# Patient Record
Sex: Male | Born: 1935 | Race: White | Hispanic: No | Marital: Married | Smoking: Former smoker
Health system: Southern US, Community
[De-identification: ages and names within clinical notes are randomized; demographics above are authoritative.]

## PROBLEM LIST (undated history)

## (undated) DIAGNOSIS — E785 Hyperlipidemia, unspecified: Secondary | ICD-10-CM

## (undated) DIAGNOSIS — R911 Solitary pulmonary nodule: Secondary | ICD-10-CM

## (undated) DIAGNOSIS — N400 Enlarged prostate without lower urinary tract symptoms: Secondary | ICD-10-CM

## (undated) DIAGNOSIS — T17908A Unspecified foreign body in respiratory tract, part unspecified causing other injury, initial encounter: Secondary | ICD-10-CM

## (undated) DIAGNOSIS — J690 Pneumonitis due to inhalation of food and vomit: Secondary | ICD-10-CM

## (undated) DIAGNOSIS — J449 Chronic obstructive pulmonary disease, unspecified: Secondary | ICD-10-CM

## (undated) DIAGNOSIS — E46 Unspecified protein-calorie malnutrition: Secondary | ICD-10-CM

---

## 2020-01-03 ENCOUNTER — Encounter (HOSPITAL_COMMUNITY): Payer: Self-pay | Admitting: Internal Medicine

## 2020-01-03 ENCOUNTER — Inpatient Hospital Stay (HOSPITAL_COMMUNITY)
Admission: AD | Admit: 2020-01-03 | Discharge: 2020-01-11 | DRG: 444 | Disposition: A | Discharge status: Skilled Nursing Facility | Payer: Medicare Other | Source: Other Acute Inpatient Hospital | Attending: Family Medicine | Admitting: Family Medicine

## 2020-01-03 DIAGNOSIS — Z801 Family history of malignant neoplasm of trachea, bronchus and lung: Secondary | ICD-10-CM

## 2020-01-03 DIAGNOSIS — Z515 Encounter for palliative care: Secondary | ICD-10-CM | POA: Diagnosis not present

## 2020-01-03 DIAGNOSIS — R627 Adult failure to thrive: Secondary | ICD-10-CM | POA: Diagnosis present

## 2020-01-03 DIAGNOSIS — K8050 Calculus of bile duct without cholangitis or cholecystitis without obstruction: Secondary | ICD-10-CM

## 2020-01-03 DIAGNOSIS — E876 Hypokalemia: Secondary | ICD-10-CM | POA: Diagnosis not present

## 2020-01-03 DIAGNOSIS — F329 Major depressive disorder, single episode, unspecified: Secondary | ICD-10-CM | POA: Diagnosis present

## 2020-01-03 DIAGNOSIS — Z7951 Long term (current) use of inhaled steroids: Secondary | ICD-10-CM

## 2020-01-03 DIAGNOSIS — R1312 Dysphagia, oropharyngeal phase: Secondary | ICD-10-CM | POA: Diagnosis present

## 2020-01-03 DIAGNOSIS — E872 Acidosis: Secondary | ICD-10-CM | POA: Diagnosis not present

## 2020-01-03 DIAGNOSIS — T85528A Displacement of other gastrointestinal prosthetic devices, implants and grafts, initial encounter: Secondary | ICD-10-CM

## 2020-01-03 DIAGNOSIS — R17 Unspecified jaundice: Secondary | ICD-10-CM | POA: Diagnosis present

## 2020-01-03 DIAGNOSIS — K831 Obstruction of bile duct: Secondary | ICD-10-CM | POA: Diagnosis not present

## 2020-01-03 DIAGNOSIS — Z8249 Family history of ischemic heart disease and other diseases of the circulatory system: Secondary | ICD-10-CM

## 2020-01-03 DIAGNOSIS — E43 Unspecified severe protein-calorie malnutrition: Secondary | ICD-10-CM | POA: Diagnosis present

## 2020-01-03 DIAGNOSIS — R54 Age-related physical debility: Secondary | ICD-10-CM | POA: Diagnosis present

## 2020-01-03 DIAGNOSIS — Z20822 Contact with and (suspected) exposure to covid-19: Secondary | ICD-10-CM | POA: Diagnosis not present

## 2020-01-03 DIAGNOSIS — K851 Biliary acute pancreatitis without necrosis or infection: Secondary | ICD-10-CM | POA: Diagnosis present

## 2020-01-03 DIAGNOSIS — G3184 Mild cognitive impairment, so stated: Secondary | ICD-10-CM | POA: Diagnosis present

## 2020-01-03 DIAGNOSIS — K838 Other specified diseases of biliary tract: Secondary | ICD-10-CM | POA: Diagnosis present

## 2020-01-03 DIAGNOSIS — J439 Emphysema, unspecified: Secondary | ICD-10-CM | POA: Diagnosis present

## 2020-01-03 DIAGNOSIS — K2971 Gastritis, unspecified, with bleeding: Secondary | ICD-10-CM | POA: Diagnosis not present

## 2020-01-03 DIAGNOSIS — N400 Enlarged prostate without lower urinary tract symptoms: Secondary | ICD-10-CM | POA: Diagnosis present

## 2020-01-03 DIAGNOSIS — Z8701 Personal history of pneumonia (recurrent): Secondary | ICD-10-CM

## 2020-01-03 DIAGNOSIS — Z66 Do not resuscitate: Secondary | ICD-10-CM | POA: Diagnosis not present

## 2020-01-03 DIAGNOSIS — G9341 Metabolic encephalopathy: Secondary | ICD-10-CM | POA: Diagnosis not present

## 2020-01-03 DIAGNOSIS — J449 Chronic obstructive pulmonary disease, unspecified: Secondary | ICD-10-CM

## 2020-01-03 DIAGNOSIS — Z9981 Dependence on supplemental oxygen: Secondary | ICD-10-CM

## 2020-01-03 DIAGNOSIS — Z87891 Personal history of nicotine dependence: Secondary | ICD-10-CM

## 2020-01-03 DIAGNOSIS — N1330 Unspecified hydronephrosis: Secondary | ICD-10-CM | POA: Diagnosis not present

## 2020-01-03 DIAGNOSIS — Z7952 Long term (current) use of systemic steroids: Secondary | ICD-10-CM

## 2020-01-03 DIAGNOSIS — R64 Cachexia: Secondary | ICD-10-CM | POA: Diagnosis present

## 2020-01-03 DIAGNOSIS — Z800 Family history of malignant neoplasm of digestive organs: Secondary | ICD-10-CM

## 2020-01-03 DIAGNOSIS — Z8719 Personal history of other diseases of the digestive system: Secondary | ICD-10-CM | POA: Diagnosis not present

## 2020-01-03 DIAGNOSIS — T17908A Unspecified foreign body in respiratory tract, part unspecified causing other injury, initial encounter: Secondary | ICD-10-CM

## 2020-01-03 DIAGNOSIS — J9622 Acute and chronic respiratory failure with hypercapnia: Secondary | ICD-10-CM | POA: Diagnosis not present

## 2020-01-03 DIAGNOSIS — J9621 Acute and chronic respiratory failure with hypoxia: Secondary | ICD-10-CM | POA: Diagnosis not present

## 2020-01-03 DIAGNOSIS — E785 Hyperlipidemia, unspecified: Secondary | ICD-10-CM | POA: Diagnosis present

## 2020-01-03 DIAGNOSIS — K8071 Calculus of gallbladder and bile duct without cholecystitis with obstruction: Principal | ICD-10-CM | POA: Diagnosis present

## 2020-01-03 DIAGNOSIS — Z888 Allergy status to other drugs, medicaments and biological substances status: Secondary | ICD-10-CM

## 2020-01-03 DIAGNOSIS — R634 Abnormal weight loss: Secondary | ICD-10-CM | POA: Diagnosis not present

## 2020-01-03 DIAGNOSIS — R945 Abnormal results of liver function studies: Secondary | ICD-10-CM | POA: Diagnosis not present

## 2020-01-03 DIAGNOSIS — R109 Unspecified abdominal pain: Secondary | ICD-10-CM

## 2020-01-03 DIAGNOSIS — K859 Acute pancreatitis without necrosis or infection, unspecified: Secondary | ICD-10-CM | POA: Diagnosis not present

## 2020-01-03 DIAGNOSIS — Z7189 Other specified counseling: Secondary | ICD-10-CM | POA: Diagnosis not present

## 2020-01-03 DIAGNOSIS — R531 Weakness: Secondary | ICD-10-CM | POA: Diagnosis not present

## 2020-01-03 DIAGNOSIS — T17908S Unspecified foreign body in respiratory tract, part unspecified causing other injury, sequela: Secondary | ICD-10-CM | POA: Diagnosis not present

## 2020-01-03 DIAGNOSIS — Z79899 Other long term (current) drug therapy: Secondary | ICD-10-CM

## 2020-01-03 DIAGNOSIS — R0602 Shortness of breath: Secondary | ICD-10-CM | POA: Diagnosis not present

## 2020-01-03 DIAGNOSIS — Z9889 Other specified postprocedural states: Secondary | ICD-10-CM | POA: Diagnosis not present

## 2020-01-03 HISTORY — DX: Benign prostatic hyperplasia without lower urinary tract symptoms: N40.0

## 2020-01-03 HISTORY — DX: Unspecified foreign body in respiratory tract, part unspecified causing other injury, initial encounter: T17.908A

## 2020-01-03 HISTORY — DX: Hyperlipidemia, unspecified: E78.5

## 2020-01-03 HISTORY — DX: Pneumonitis due to inhalation of food and vomit: J69.0

## 2020-01-03 HISTORY — DX: Solitary pulmonary nodule: R91.1

## 2020-01-03 HISTORY — DX: Unspecified protein-calorie malnutrition: E46

## 2020-01-03 HISTORY — DX: Chronic obstructive pulmonary disease, unspecified: J44.9

## 2020-01-03 LAB — COMPREHENSIVE METABOLIC PANEL
ALT: 311 U/L — ABNORMAL HIGH (ref 0–44)
AST: 143 U/L — ABNORMAL HIGH (ref 15–41)
Albumin: 3.7 g/dL (ref 3.5–5.0)
Alkaline Phosphatase: 307 U/L — ABNORMAL HIGH (ref 38–126)
Anion gap: 13 (ref 5–15)
BUN: 28 mg/dL — ABNORMAL HIGH (ref 8–23)
CO2: 25 mmol/L (ref 22–32)
Calcium: 9.4 mg/dL (ref 8.9–10.3)
Chloride: 99 mmol/L (ref 98–111)
Creatinine, Ser: 0.83 mg/dL (ref 0.61–1.24)
GFR calc Af Amer: >60 mL/min (ref >60)
GFR calc non Af Amer: >60 mL/min (ref >60)
Glucose, Bld: 94 mg/dL (ref 70–99)
Potassium: 5 mmol/L (ref 3.5–5.1)
Sodium: 137 mmol/L (ref 135–145)
Total Bilirubin: 3.4 mg/dL — ABNORMAL HIGH (ref 0.3–1.2)
Total Protein: 6.4 g/dL — ABNORMAL LOW (ref 6.5–8.1)

## 2020-01-03 LAB — CBC WITH DIFFERENTIAL/PLATELET
Abs Immature Granulocytes: 0.05 10*3/uL (ref 0.00–0.07)
Basophils Absolute: 0 10*3/uL (ref 0.0–0.1)
Basophils Relative: 0 %
Eosinophils Absolute: 0 10*3/uL (ref 0.0–0.5)
Eosinophils Relative: 0 %
HCT: 44.7 % (ref 39.0–52.0)
Hemoglobin: 14 g/dL (ref 13.0–17.0)
Immature Granulocytes: 1 %
Lymphocytes Relative: 2 %
Lymphs Abs: 0.2 10*3/uL — ABNORMAL LOW (ref 0.7–4.0)
MCH: 31.5 pg (ref 26.0–34.0)
MCHC: 31.3 g/dL (ref 30.0–36.0)
MCV: 100.7 fL — ABNORMAL HIGH (ref 80.0–100.0)
Monocytes Absolute: 0.5 10*3/uL (ref 0.1–1.0)
Monocytes Relative: 5 %
Neutro Abs: 8.8 10*3/uL — ABNORMAL HIGH (ref 1.7–7.7)
Neutrophils Relative %: 92 %
Platelets: 223 10*3/uL (ref 150–400)
RBC: 4.44 MIL/uL (ref 4.22–5.81)
RDW: 14.5 % (ref 11.5–15.5)
WBC: 9.6 10*3/uL (ref 4.0–10.5)
nRBC: 0 % (ref 0.0–0.2)

## 2020-01-03 LAB — AMYLASE: Amylase: 48 U/L (ref 28–100)

## 2020-01-03 LAB — LIPASE, BLOOD: Lipase: 18 U/L (ref 11–51)

## 2020-01-03 MED ORDER — FINASTERIDE 5 MG PO TABS
5.0000 mg | ORAL_TABLET | Freq: Every day | ORAL | Status: DC
  Administered 2020-01-04 – 2020-01-11 (×8): 5 mg via ORAL
  Filled 2020-01-03 (×8): qty 1

## 2020-01-03 MED ORDER — ARFORMOTEROL TARTRATE 15 MCG/2ML IN NEBU
15.0000 ug | INHALATION_SOLUTION | Freq: Two times a day (BID) | RESPIRATORY_TRACT | Status: DC
  Administered 2020-01-04 – 2020-01-11 (×14): 15 ug via RESPIRATORY_TRACT
  Filled 2020-01-03 (×17): qty 2

## 2020-01-03 MED ORDER — SENNA 8.6 MG PO TABS
1.0000 | ORAL_TABLET | Freq: Two times a day (BID) | ORAL | Status: DC
  Administered 2020-01-03 – 2020-01-11 (×16): 8.6 mg via ORAL
  Filled 2020-01-03 (×16): qty 1

## 2020-01-03 MED ORDER — ALBUTEROL SULFATE HFA 108 (90 BASE) MCG/ACT IN AERS: 2.0000 | INHALATION_SPRAY | Freq: Four times a day (QID) | RESPIRATORY_TRACT | Status: DC | PRN

## 2020-01-03 MED ORDER — PIPERACILLIN-TAZOBACTAM 3.375 G IVPB 30 MIN: 3.3750 g | Freq: Four times a day (QID) | INTRAVENOUS | Status: DC

## 2020-01-03 MED ORDER — ACETAMINOPHEN 325 MG PO TABS
650.0000 mg | ORAL_TABLET | Freq: Four times a day (QID) | ORAL | Status: DC | PRN
  Administered 2020-01-07 – 2020-01-08 (×2): 650 mg via ORAL
  Filled 2020-01-03 (×2): qty 2

## 2020-01-03 MED ORDER — DEXTROSE-NACL 5-0.45 % IV SOLN: INTRAVENOUS | Status: AC

## 2020-01-03 MED ORDER — OXYCODONE HCL 5 MG PO TABS: 5.0000 mg | ORAL_TABLET | ORAL | Status: DC | PRN

## 2020-01-03 MED ORDER — PIPERACILLIN-TAZOBACTAM 3.375 G IVPB
3.3750 g | Freq: Three times a day (TID) | INTRAVENOUS | Status: AC
  Administered 2020-01-03 – 2020-01-05 (×6): 3.375 g via INTRAVENOUS
  Administered 2020-01-05: 3.375 mg via INTRAVENOUS
  Administered 2020-01-06 (×3): 3.375 g via INTRAVENOUS
  Filled 2020-01-03 (×10): qty 50

## 2020-01-03 MED ORDER — ALBUTEROL SULFATE (2.5 MG/3ML) 0.083% IN NEBU
2.5000 mg | INHALATION_SOLUTION | Freq: Four times a day (QID) | RESPIRATORY_TRACT | Status: DC | PRN
  Administered 2020-01-03: 2.5 mg via RESPIRATORY_TRACT
  Filled 2020-01-03: qty 3

## 2020-01-03 MED ORDER — ACETAMINOPHEN 650 MG RE SUPP: 650.0000 mg | Freq: Four times a day (QID) | RECTAL | Status: DC | PRN

## 2020-01-03 MED ORDER — FLUOXETINE HCL 20 MG PO CAPS
40.0000 mg | ORAL_CAPSULE | Freq: Every day | ORAL | Status: DC
  Administered 2020-01-04 – 2020-01-11 (×8): 40 mg via ORAL
  Filled 2020-01-03 (×8): qty 2

## 2020-01-03 MED ORDER — HEPARIN SODIUM (PORCINE) 5000 UNIT/ML IJ SOLN
5000.0000 [IU] | Freq: Three times a day (TID) | INTRAMUSCULAR | Status: DC
  Administered 2020-01-03 – 2020-01-04 (×2): 5000 [IU] via SUBCUTANEOUS
  Filled 2020-01-03 (×2): qty 1

## 2020-01-03 MED ORDER — ENOXAPARIN SODIUM 40 MG/0.4ML ~~LOC~~ SOLN
40.0000 mg | Freq: Every day | SUBCUTANEOUS | Status: DC
  Filled 2020-01-03: qty 0.4

## 2020-01-03 MED ORDER — TAMSULOSIN HCL 0.4 MG PO CAPS
0.4000 mg | ORAL_CAPSULE | Freq: Every day | ORAL | Status: DC
  Administered 2020-01-04 – 2020-01-11 (×8): 0.4 mg via ORAL
  Filled 2020-01-03 (×8): qty 1

## 2020-01-03 NOTE — H&P (Signed)
History and Physical    Shawn Davis MLY:[id number] DOB: 04/20/1936 DOA: 01/03/2020  PCP: Marlise Eves, MD (Confirm with patient/family/NH records and if not entered, this has to be entered at [facility] point of entry) Patient coming from: transfer from [facility]  I have personally briefly reviewed patient's old medical records in [facility]  Chief Complaint: weight loss, pancreatitis  HPI: Shawn Davis is a 84 y.o. male with medical history significant of steroid dependent COPD, h/o aspiration and aspiration pneumonia, BPH, deprression. He was receiving home health care and care givers recommended family take him to ED due to rapid weight loss and FTT. He had not been c/o pain or discomfort. His COPD was at baseline. No report of fever, N/V. He is not a good historian   ED Course: Patient was held in ED at [facility] for several days. His workup included CT ab/Pel revealing intra/extrahepatic ductal dilitation. He was diagnosed with acute pancreatitis but ERCP was not available there or MRCP. No beds were available at surrounding hospitals. He was accepted as a transfer 01/01/20 and arrived [facility] today. History is primarily from his daughter by telephone.  Review of Systems: As per HPI otherwise 10 point review of systems negative.    Past Medical History:  Diagnosis Date   Aspiration pneumonia (HCC)    Aspiration, chronic pulmonary    BPH (benign prostatic hyperplasia)    COPD (chronic obstructive pulmonary disease) (HCC)    HLD (hyperlipidemia)    Protein calorie malnutrition (HCC)    Pulmonary nodule, right     History reviewed. No pertinent surgical history.   Soc Hx - married 60 years. Two daughters. Lives with wife. Worked as Printmaker.    reports that he has quit smoking. He does not have any smokeless tobacco history on file. He reports previous alcohol use. He reports that he does not use drugs.  Allergies    Allergen Reactions   Budesonide-Formoterol Fumarate Shortness Of Breath and Other (See Comments)    Chest pain, cough  From external records; As of 01/03/20, pt states no allergies       Family History  Problem Relation Age of Onset   Pancreatic cancer Mother    CAD Mother    Lung cancer Father    CAD Brother      Prior to Admission medications   Medication Sig Start Date End Date Taking? Authorizing Provider  albuterol (VENTOLIN HFA) 108 (90 Base) MCG/ACT inhaler Inhale 2 puffs into the lungs every 6 (six) hours as needed for shortness of breath or wheezing. 12/08/19  Yes [provider]  finasteride (PROSCAR) 5 MG tablet Take 5 mg by mouth daily. 12/08/19  Yes [provider]  FLUoxetine (PROZAC) 40 MG capsule Take 40 mg by mouth daily. 12/08/19  Yes [provider]  STRIVERDI RESPIMAT 2.5 MCG/ACT AERS Inhale 2 puffs into the lungs daily. 12/08/19  Yes [provider]  tamsulosin (FLOMAX) 0.4 MG CAPS capsule Take 0.4 mg by mouth daily. 12/08/19  Yes [provider]    Physical Exam: Vitals:   01/03/20 1945  BP: 124/70  Pulse: 90  Resp: 14  Temp: 97.6 F (36.4 C)  TempSrc: Axillary  SpO2: 99%     Vitals:   01/03/20 1945  BP: 124/70  Pulse: 90  Resp: 14  Temp: 97.6 F (36.4 C)  TempSrc: Axillary  SpO2: 99%   General:- emaciated man in no acute  distress Eyes: PERRL, lids and conjunctivae normal ENMT: Mucous membranes are dry. Posterior pharynx clear of any exudate or lesions. Edentulous with upper denture in place  Neck: emaciated, supple, no masses, no thyromegaly Respiratory: Increased WOB with neck retractions and abdominal musculature use, no wheezing, no rales.  Cardiovascular: Regular rate and rhythm, no murmurs / rubs / gallops. No extremity edema. 1+ pedal pulses. No carotid bruits.  Abdomen: Scaphoid. Positive guarding vs respiratory use, tender to percussion in the RUQ and epigastrum, tendern to firm  palpation in the epigastrum and RUQ. No masses. Musculoskeletal: no clubbing / cyanosis. No joint deformity upper and lower extremities. Good ROM, no contractures. Muscle wasting. Emaciated throughout..  Skin: multiple bruises both arms, Skin abrasion with duoderm dressing in place left posterior chest. Neurologic: CN 2-12 grossly intact.Strength 3/5 in all 4.  Psychiatric: Normal judgment and insight. Alert and oriented x 3. Normal mood.     Labs on Admission: I have personally reviewed following labs and imaging studies  CBC: No results for input(s): WBC, NEUTROABS, HGB, HCT, MCV, PLT in the last 168 hours. Basic Metabolic Panel: No results for input(s): NA, K, CL, CO2, GLUCOSE, BUN, CREATININE, CALCIUM, MG, PHOS in the last 168 hours. GFR: CrCl cannot be calculated (No successful lab value found.). Liver Function Tests: No results for input(s): AST, ALT, ALKPHOS, BILITOT, PROT, ALBUMIN in the last 168 hours. No results for input(s): LIPASE, AMYLASE in the last 168 hours. No results for input(s): AMMONIA in the last 168 hours. Coagulation Profile: No results for input(s): INR, PROTIME in the last 168 hours. Cardiac Enzymes: No results for input(s): CKTOTAL, CKMB, CKMBINDEX, TROPONINI in the last 168 hours. BNP (last 3 results) No results for input(s): PROBNP in the last 8760 hours. HbA1C: No results for input(s): HGBA1C in the last 72 hours. CBG: No results for input(s): GLUCAP in the last 168 hours. Lipid Profile: No results for input(s): CHOL, HDL, LDLCALC, TRIG, CHOLHDL, LDLDIRECT in the last 72 hours. Thyroid Function Tests: No results for input(s): TSH, T4TOTAL, FREET4, T3FREE, THYROIDAB in the last 72 hours. Anemia Panel: No results for input(s): VITAMINB12, FOLATE, FERRITIN, TIBC, IRON, RETICCTPCT in the last 72 hours. Urine analysis: No results found for: COLORURINE, APPEARANCEUR, LABSPEC, PHURINE, GLUCOSEU, HGBUR, BILIRUBINUR, KETONESUR, PROTEINUR, UROBILINOGEN,  NITRITE, LEUKOCYTESUR  Radiological Exams on Admission: No results found.  EKG: Independently reviewed. Outside EGK NSR w/o acute changes  Assessment/Plan Active Problems:   Pancreatitis, acute   Steroid-dependent COPD (HCC)   BPH (benign prostatic hyperplasia)   Chronic pulmonary aspiration  (please populate well all problems here in Problem List. (For example, if patient is on BP meds at home and you resume or decide to hold them, it is a problem that needs to be her. Same for CAD, COPD, HLD and so on)   1. Pancreatitis - reason for transfer was for specialty evaluation and procedure, e.g MRCP and possible ERCP. He was started on Zosyn at outside hospital. He had CT revealing ductal dilitation and GB sludge with multiple small stones.  He denies pain but is tender on exam. Plan Med-surg admit  Labs - CMEt, lipase, amylase, CBCD  Continue Zosyn to cover for intra-abdominal infection  Pain mgt.   MRCP ordered  Will need GI consult 8/15-called answering service  2. Steroid dependent COPD Plan Continue home meds  Add Albuterol neb q2 prn wheezing  Continue oxygen at 2 L   3. BPH - continue home meds  4. Code status - spoke at length  with daughter Shawn Davis and patient - does not wish life-support or heroic measures.  DVT prophylaxis: heparin  Code Status: DNR  Family Communication: spoke at length with Shawn Davis, daughter  Disposition Plan: TBD - TOC consult placed  Consults called: GI - notified [facility] answering service for consult in AM (with names) Admission status: inpatient    Illene Regulus MD [facility] Pager [phone]  If 7PM-7AM, please contact night-coverage [email or web address] Password [facility]  01/03/2020, 10:38 PM   \

## 2020-01-04 ENCOUNTER — Other Ambulatory Visit: Payer: Self-pay

## 2020-01-04 ENCOUNTER — Inpatient Hospital Stay: Payer: Self-pay

## 2020-01-04 ENCOUNTER — Encounter (HOSPITAL_COMMUNITY): Payer: Self-pay | Admitting: Internal Medicine

## 2020-01-04 DIAGNOSIS — K838 Other specified diseases of biliary tract: Secondary | ICD-10-CM

## 2020-01-04 DIAGNOSIS — R634 Abnormal weight loss: Secondary | ICD-10-CM

## 2020-01-04 DIAGNOSIS — R945 Abnormal results of liver function studies: Secondary | ICD-10-CM | POA: Diagnosis not present

## 2020-01-04 DIAGNOSIS — K859 Acute pancreatitis without necrosis or infection, unspecified: Secondary | ICD-10-CM

## 2020-01-04 LAB — HEPATITIS PANEL, ACUTE
HCV Ab: NONREACTIVE
Hep A IgM: NONREACTIVE
Hep B C IgM: NONREACTIVE
Hepatitis B Surface Ag: NONREACTIVE

## 2020-01-04 LAB — COMPREHENSIVE METABOLIC PANEL
ALT: 293 U/L — ABNORMAL HIGH (ref 0–44)
AST: 132 U/L — ABNORMAL HIGH (ref 15–41)
Albumin: 3.5 g/dL (ref 3.5–5.0)
Alkaline Phosphatase: 273 U/L — ABNORMAL HIGH (ref 38–126)
Anion gap: 12 (ref 5–15)
BUN: 29 mg/dL — ABNORMAL HIGH (ref 8–23)
CO2: 28 mmol/L (ref 22–32)
Calcium: 9 mg/dL (ref 8.9–10.3)
Chloride: 97 mmol/L — ABNORMAL LOW (ref 98–111)
Creatinine, Ser: 0.83 mg/dL (ref 0.61–1.24)
GFR calc Af Amer: >60 mL/min (ref >60)
GFR calc non Af Amer: >60 mL/min (ref >60)
Glucose, Bld: 131 mg/dL — ABNORMAL HIGH (ref 70–99)
Potassium: 4.6 mmol/L (ref 3.5–5.1)
Sodium: 137 mmol/L (ref 135–145)
Total Bilirubin: 2.4 mg/dL — ABNORMAL HIGH (ref 0.3–1.2)
Total Protein: 5.9 g/dL — ABNORMAL LOW (ref 6.5–8.1)

## 2020-01-04 LAB — CBC
HCT: 41.5 % (ref 39.0–52.0)
Hemoglobin: 13.2 g/dL (ref 13.0–17.0)
MCH: 32.2 pg (ref 26.0–34.0)
MCHC: 31.8 g/dL (ref 30.0–36.0)
MCV: 101.2 fL — ABNORMAL HIGH (ref 80.0–100.0)
Platelets: 222 10*3/uL (ref 150–400)
RBC: 4.1 MIL/uL — ABNORMAL LOW (ref 4.22–5.81)
RDW: 14.6 % (ref 11.5–15.5)
WBC: 8.8 10*3/uL (ref 4.0–10.5)
nRBC: 0 % (ref 0.0–0.2)

## 2020-01-04 LAB — SARS CORONAVIRUS 2 BY RT PCR (HOSPITAL ORDER, PERFORMED IN ~~LOC~~ HOSPITAL LAB): SARS Coronavirus 2: NEGATIVE

## 2020-01-04 LAB — MAGNESIUM: Magnesium: 2 mg/dL (ref 1.7–2.4)

## 2020-01-04 LAB — LIPASE, BLOOD: Lipase: 22 U/L (ref 11–51)

## 2020-01-04 NOTE — Evaluation (Signed)
Physical Therapy Evaluation Patient Details Name: Shawn Davis MRN: [id number] DOB: [birth year] Today's Date: 01/04/2020   History of Present Illness  84 y.o. male with medical history significant of steroid dependent COPD, h/o aspiration and aspiration pneumonia, BPH, protein calorie malnutrition depression admitted to [facility] 8/14 for acute pancreatitis. Recent hx of failure to thrive and weight loss.  Clinical Impression  Mr. Chanz Davis is an 84 year old man admitted to [facility] for acute pancreatitis with family reporting recent failure to thrive and weight loss  Pt presents with functional mobility limitations 2* generalized weakness, ambulatory balance deficits, and ltd endurance.  Patient mildly dyspneic during activity on 2L [place] and patient placed [place] in mouth to assist with recovery. O2 sats WNL beween 98-100%. Patient required min assist for mobility and max assist for lower body bathing and toileting after incontinent episode. Patient pleasant and mostly oriented with deficits of memory. Patient will benefit from skilled PT services to maximize IND and functional abilities for return home at discharge. Patient receiving HH services prior to admission and therapist recommends resuming therapy at discharge    Follow Up Recommendations Home health PT    Equipment Recommendations  None recommended by PT    Recommendations for Other Services       Precautions / Restrictions Precautions Precautions: Fall Restrictions Weight Bearing Restrictions: No      Mobility  Bed Mobility Overal bed mobility: Needs Assistance Bed Mobility: Supine to Sit     Supine to sit: Min assist     General bed mobility comments: min assist to bring trunk to upright  Transfers Overall transfer level: Needs assistance Equipment used: Rolling walker (2 wheeled) Transfers: Sit to/from [employer] Sit to Stand: Min assist Stand pivot transfers: Min assist       General transfer  comment: cues for use of UEs to self assist;  Physical assist to bring wt up and fwd and to balance in initial standing.   Pt stood from bedside x 3 and stood with RW and min assist for balance to be assisted with hygiene 2* bowel incontinence  Ambulation/Gait Ambulation/Gait assistance: Min assist;+2 safety/equipment Gait Distance (Feet): 5 Feet Assistive device: Rolling walker (2 wheeled) Gait Pattern/deviations: Step-to pattern;Decreased step length - right;Decreased step length - left;Shuffle;Trunk flexed     General Gait Details: cues for posture and position from RW; distance ltd by fatigue/SOB with exertion  Stairs            Wheelchair Mobility    Modified Rankin (Stroke Patients Only)       Balance Overall balance assessment: Needs assistance Sitting-balance support: No upper extremity supported;Feet supported Sitting balance-Leahy Scale: Good     Standing balance support: Bilateral upper extremity supported;During functional activity Standing balance-Leahy Scale: Poor Standing balance comment: reliance on upper extremities                             Pertinent Vitals/Pain Pain Assessment: No/denies pain    Home Living Family/patient expects to be discharged to:: Private residence Living Arrangements: Spouse/significant other Available Help at Discharge: Family;Available 24 hours/day Type of Home: House Home Access: Stairs to enter     Home Layout: One level Home Equipment: Environmental consultant - 2 wheels Additional Comments: Patient lives at home with his wife. Has been receiving HH OT/PT. ON 2-3L oxygen at home.    Prior Function Level of Independence: Independent with assistive device(s)  Hand Dominance   Dominant Hand: Right    Extremity/Trunk Assessment   Upper Extremity Assessment Upper Extremity Assessment: Overall WFL for tasks assessed RUE Deficits / Details: 5/5 strength throughout, some loss of shoulder ROM LUE  Deficits / Details: 5/5 strength throughout, some loss of shoulder ROM    Lower Extremity Assessment Lower Extremity Assessment: Overall WFL for tasks assessed    Cervical / Trunk Assessment Cervical / Trunk Assessment: Kyphotic  Communication   Communication: No difficulties  Cognition Arousal/Alertness: Awake/alert Behavior During Therapy: WFL for tasks assessed/performed;Impulsive Overall Cognitive Status: History of cognitive impairments - at baseline                                 General Comments: Daughter reports impaired short term memory recently.      General Comments      Exercises     Assessment/Plan    PT Assessment Patient needs continued PT services  PT Problem List Decreased activity tolerance;Decreased balance;Decreased mobility;Decreased cognition;Decreased knowledge of use of DME       PT Treatment Interventions DME instruction;Gait training;Stair training;Functional mobility training;Therapeutic activities;Therapeutic exercise;Patient/family education    PT Goals (Current goals can be found in the Care Plan section)  Acute Rehab PT Goals Patient Stated Goal: Did not state PT Goal Formulation: With patient Time For Goal Achievement: 01/18/20 Potential to Achieve Goals: Fair    Frequency Min 3X/week   Barriers to discharge        Co-evaluation PT/OT/SLP Co-Evaluation/Treatment: Yes   PT goals addressed during session: Mobility/safety with mobility OT goals addressed during session: ADL's and self-care       AM-PAC PT "6 Clicks" Mobility  Outcome Measure Help needed turning from your back to your side while in a flat bed without using bedrails?: None Help needed moving from lying on your back to sitting on the side of a flat bed without using bedrails?: A Little Help needed moving to and from a bed to a chair (including a wheelchair)?: A Little Help needed standing up from a chair using your arms (e.g., wheelchair or bedside  chair)?: A Little Help needed to walk in hospital room?: A Little Help needed climbing 3-5 steps with a railing? : A Lot 6 Click Score: 18    End of Session Equipment Utilized During Treatment: Gait belt;Oxygen Activity Tolerance: Patient limited by fatigue Patient left: with call bell/phone within reach;Other (comment) (BSC) Nurse Communication: Mobility status PT Visit Diagnosis: Unsteadiness on feet (R26.81);Muscle weakness (generalized) (M62.81);Difficulty in walking, not elsewhere classified (R26.2)    Time: 8921-1941 PT Time Calculation (min) (ACUTE ONLY): 25 min   Charges:   PT Evaluation $PT Eval Low Complexity: 1 Low          Mauro Kaufmann PT Acute Rehabilitation Services Pager [phone] Office [phone]   Cleo Santucci 01/04/2020, 2:18 PM

## 2020-01-04 NOTE — Progress Notes (Signed)
PROGRESS NOTE    Shawn Davis  TOI:[id number] DOB: [birth year] DOA: 01/03/2020 PCP: Darius Bump, MD   Chief complaint: FTT  Brief Narrative: 84 y.o. male with medical history significant of steroid dependent COPD, h/o aspiration and aspiration pneumonia, BPH, protein calorie malnutrition depression who has had rapid weight loss and failure to thrive and sent to the ED at [facility] 01/01/20 where work-up showed CT scan showing intra-/extrahepatic duct dilatation diagnosing acute pancreatitis and transferred to [facility] 01/03/20.   Subjective:  Overnight no fever blood pressure is stable, saturating well on room air. Labs AST 143 ALT 311, alk phos 307, lipase 18 lft slightly better this am.  Assessment & Plan:  Pancreatitis, acute, diagnosed at outside ER.Patient is n.p.o., on D5 half-normal saline, empiric Zosyn and and conservative treatment.MRCP pending.  GI has been consulted overnight on admission.  Noted transaminitis with normal lipase.Repeat CMP lipase this morning and viral hepatitis panel. He denies any abdominal pain nausea or vomiting.  Failure to thrive/rapid weight loss:poor oral intake for few months, with chronic medical condition. Follow-up MRI.Dietitian consulted.  COPD/Chronic hypoxic respiratory failure on 2-3 l [place].per daughter "He has trouble breathing all the time": On room air.  To new inhaler.  BPH continue Flomax/finasteride.  Chronic pulmonary aspiration history followed by speech and nutrition at home.   DVT prophylaxis: SCD and consider prophylaxis if no procedure Code Status:   Code Status: DNR  Family Communication: plan of care discussed with patient and daughter at bedside.  Status is: Inpatient Remains inpatient appropriate because:Ongoing diagnostic testing needed not appropriate for outpatient work up and IV treatments appropriate due to intensity of illness or inability to take PO Dispo:The patient is from:  Home            Anticipated d/c is to: Home            Anticipated d/c date is: 2 days            Patient currently is not medically stable to d/c.   Nutrition: Diet Order            Diet NPO time specified Except for: Sips with Meds  Diet effective now                       There is no height or weight on file to calculate BMI.  Consultants:see note  Procedures:see note Microbiology:see note Blood Culture No results found for: SDES, SPECREQUEST, CULT, REPTSTATUS  Other culture-see note  Medications: Scheduled Meds: . arformoterol  15 mcg Nebulization BID  . finasteride  5 mg Oral Daily  . FLUoxetine  40 mg Oral Daily  . senna  1 tablet Oral BID  . tamsulosin  0.4 mg Oral Daily   Continuous Infusions: . dextrose 5 % and 0.45% NaCl 75 mL/hr at 01/03/20 2259  . piperacillin-tazobactam (ZOSYN)  IV 3.375 g (01/04/20 0525)    Antimicrobials: Anti-infectives (From admission, onward)   Start     Dose/Rate Route Frequency Ordered Stop   01/04/20 0000  piperacillin-tazobactam (ZOSYN) IVPB 3.375 g  Status:  Discontinued        3.375 g 100 mL/hr over 30 Minutes Intravenous Every 6 hours 01/03/20 2230 01/03/20 2235   01/03/20 2245  piperacillin-tazobactam (ZOSYN) IVPB 3.375 g     Discontinue     3.375 g 12.5 mL/hr over 240 Minutes Intravenous Every 8 hours 01/03/20 2235  Objective: Vitals: Today's Vitals   01/03/20 1945 01/03/20 2340 01/04/20 0342 01/04/20 0901  BP: 124/70 (!) 154/82 (!) 162/79   Pulse: 90 95 82   Resp: _0 Temp: 97.6 F (36.4 C) 98.2 F (36.8 C) 97.7 F (36.5 C)   TempSrc: Axillary Oral Oral   SpO2: 99% 98% 98% 98%  PainSc: 0-No pain       Intake/Output Summary (Last 24 hours) at 01/04/2020 0906 Last data filed at 01/04/2020 0600 Gross per 24 hour  Intake 582.16 ml  Output 75 ml  Net 507.16 ml   There were no vitals filed for this visit. Weight change:    Intake/Output from previous day: 08/14 0701 - 08/15 0700 In:  582.2 [I.V.:525; IV Piggyback:57.2] Out: 75 [Urine:75] Intake/Output this shift: No intake/output data recorded.  Examination:  General exam: AAOx3, frail, on [place] 02,  HEENT:Oral mucosa moist, Ear/Nose WNL grossly,dentition normal. Respiratory system: bilaterally diminished,no wheezing or crackles,no use of accessory muscle, non tender. Cardiovascular system: S1 & S2 +, regular, No JVD. Gastrointestinal system: Abdomen soft, scaphoid, NT,ND, BS+. Nervous System:Alert, awake, moving extremities and grossly nonfocal Extremities: No edema, distal peripheral pulses palpable.  Skin: No rashes,no icterus. MSK: Normal muscle bulk,tone, power  Data Reviewed: I have personally reviewed following labs and imaging studies CBC: Recent Labs  Lab 01/03/20 2256  WBC 9.6  NEUTROABS 8.8*  HGB 14.0  HCT 44.7  MCV 100.7*  PLT 160   Basic Metabolic Panel: Recent Labs  Lab 01/03/20 2256 01/04/20 0815  NA 137 137  K 5.0 4.6  CL 99 97*  CO2 25 28  GLUCOSE 94 131*  BUN 28* 29*  CREATININE 0.83 0.83  CALCIUM 9.4 9.0  MG  --  2.0   GFR: CrCl cannot be calculated (Unknown ideal weight.). Liver Function Tests: Recent Labs  Lab 01/03/20 2256 01/04/20 0815  AST 143* 132*  ALT 311* 293*  ALKPHOS 307* 273*  BILITOT 3.4* 2.4*  PROT 6.4* 5.9*  ALBUMIN 3.7 3.5   Recent Labs  Lab 01/03/20 2256 01/04/20 0815  LIPASE 18 22  AMYLASE 48  --    No results for input(s): AMMONIA in the last 168 hours. Coagulation Profile: No results for input(s): INR, PROTIME in the last 168 hours. Cardiac Enzymes: No results for input(s): CKTOTAL, CKMB, CKMBINDEX, TROPONINI in the last 168 hours. BNP (last 3 results) No results for input(s): PROBNP in the last 8760 hours. HbA1C: No results for input(s): HGBA1C in the last 72 hours. CBG: No results for input(s): GLUCAP in the last 168 hours. Lipid Profile: No results for input(s): CHOL, HDL, LDLCALC, TRIG, CHOLHDL, LDLDIRECT in the last 72  hours. Thyroid Function Tests: No results for input(s): TSH, T4TOTAL, FREET4, T3FREE, THYROIDAB in the last 72 hours. Anemia Panel: No results for input(s): VITAMINB12, FOLATE, FERRITIN, TIBC, IRON, RETICCTPCT in the last 72 hours. Sepsis Labs: No results for input(s): PROCALCITON, LATICACIDVEN in the last 168 hours.  No results found for this or any previous visit (from the past 240 hour(s)).    Radiology Studies: No results found.   LOS: 1 day   Antonieta Pert, MD [facility]  01/04/2020, 9:06 AM

## 2020-01-04 NOTE — Consult Note (Signed)
Referring Provider:  [facility]         Primary Care Physician:  Marlise EvesButler, Amy E, MD Primary Gastroenterologist: Gentry FitzUnassigned.           We were asked to see this patient for:   Bile duct stones               ASSESSMENT /  PLAN    CC: Bile duct stones  Shawn Davis is a 84 y.o. male PMH significant for, but not necessarily limited to, severe COPD, aspiration pneumonia, BPH                                                                                                                                    #Abnormal LFTs/biliary duct dilation on CT scan done at [facility] --Patient has his gallbladder intact with calculi present --Patient is at high risk for endoscopic procedure given severe COPD.  Plan is for MRCP today and depending on results he will likely need ERCP with stone extraction tomorrow.  The benefits and risk of ERCP including perforation, infection, pancreatitis, and complications of sedation were discussed with the patient and his daughter.  Patient agrees to proceed with ERCP should if be necessary.  --If turns out to have choledocholithiasis then will need surgical consult for consideration of cholecystectomy  #  Malnourishment / recent weight loss.    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    HPI:    Chief Complaint: None.   Shawn Davis is a 84 y.o. male with severe COPD who was transferred here from a hospital in [place].  Patient's daughter provides most of the history.  She believes it was on Thursday that patient was taken [place] the hospital in [place].   Review[facility] paper records from [facility].  CBC 6.1, hemoglobin 12.1, MCV 99, platelets 190.  Lipase normal at 130. Patient found to have markedly elevated LFTs.  CT scan of the abdomen and pelvis with contrast showed severe intrahepatic and proximal extrahepatic biliary dilation with scattered areas of intermediate density material especially within the left  hepatic duct possibly reflecting biliary sludge.  The gallbladder was incompletely distended containing less than 1 cm calculi.  Pancreas was atrophic.  Spleen unremarkable.  Moderate right greater than left hydronephrosis.  No definite nephrolithiasis.  Moderate fecal retention.  Mild colonic diverticulosis. Normal appendix.  According to the daughter, patient has not been having any complaints of abdominal pain, nausea nor vomiting.  Patient lives at home with his wife but daughter visits frequently.  She says his appetite has diminished in the recent past.  No known family history of colon cancer or pancreatic cancer.  Daughter does not think patient has any chronic GI problems. Patient himself denies abdominal pain just occasional gas discomfort in the abdomen.  Patient oriented overall.  Patient knows he is in the hospital but did not realize  he had been transferred from [facility] and today his liver test show improvement.  AST down to 132, ALT 293.  Alkaline phosphatase 273.  White count remains normal.  Lipase has been normal this admission   Past Medical History:  Diagnosis Date  . Aspiration pneumonia (HCC)   . Aspiration, chronic pulmonary   . BPH (benign prostatic hyperplasia)   . COPD (chronic obstructive pulmonary disease) (HCC)   . HLD (hyperlipidemia)   . Protein calorie malnutrition (HCC)   . Pulmonary nodule, right     History reviewed. No pertinent surgical history.  Prior to Admission medications   Medication Sig Start Date End Date Taking? Authorizing Provider  albuterol (VENTOLIN HFA) 108 (90 Base) MCG/ACT inhaler Inhale 2 puffs into the lungs every 6 (six) hours as needed for shortness of breath or wheezing. 12/08/19  Yes [provider]  finasteride (PROSCAR) 5 MG tablet Take 5 mg by mouth daily. 12/08/19  Yes [provider]  FLUoxetine (PROZAC) 40 MG capsule Take 40 mg by mouth daily. 12/08/19  Yes [provider]  STRIVERDI RESPIMAT  2.5 MCG/ACT AERS Inhale 2 puffs into the lungs daily. 12/08/19  Yes [provider]  tamsulosin (FLOMAX) 0.4 MG CAPS capsule Take 0.4 mg by mouth daily. 12/08/19  Yes [provider]    Current Facility-Administered Medications  Medication Dose Route Frequency Provider Last Rate Last Admin  . acetaminophen (TYLENOL) tablet 650 mg  650 mg Oral Q6H PRN Norins, Rosalyn Gess, MD       Or  . acetaminophen (TYLENOL) suppository 650 mg  650 mg Rectal Q6H PRN Norins, Rosalyn Gess, MD      . albuterol (PROVENTIL) (2.5 MG/3ML) 0.083% nebulizer solution 2.5 mg  2.5 mg Nebulization Q6H PRN Norins, Rosalyn Gess, MD   2.5 mg at 01/03/20 2342  . arformoterol (BROVANA) nebulizer solution 15 mcg  15 mcg Nebulization BID Jacques Navy, MD   15 mcg at 01/04/20 0901  . dextrose 5 %-0.45 % sodium chloride infusion   Intravenous Continuous Norins, Rosalyn Gess, MD 75 mL/hr at 01/03/20 2259 New Bag at 01/03/20 2259  . finasteride (PROSCAR) tablet 5 mg  5 mg Oral Daily Norins, Rosalyn Gess, MD   5 mg at 01/04/20 1002  . FLUoxetine (PROZAC) capsule 40 mg  40 mg Oral Daily Norins, Rosalyn Gess, MD   40 mg at 01/04/20 0959  . oxyCODONE (Oxy IR/ROXICODONE) immediate release tablet 5 mg  5 mg Oral Q4H PRN Norins, Rosalyn Gess, MD      . piperacillin-tazobactam (ZOSYN) IVPB 3.375 g  3.375 g Intravenous Q8H Norins, Rosalyn Gess, MD 12.5 mL/hr at 01/04/20 0525 3.375 g at 01/04/20 0525  . senna (SENOKOT) tablet 8.6 mg  1 tablet Oral BID Jacques Navy, MD   8.6 mg at 01/04/20 0959  . tamsulosin (FLOMAX) capsule 0.4 mg  0.4 mg Oral Daily Norins, Rosalyn Gess, MD   0.4 mg at 01/04/20 1012    Allergies as of 01/02/2020  . (Not on File)    Family History  Problem Relation Age of Onset  . Pancreatic cancer Mother   . CAD Mother   . Lung cancer Father   . CAD Brother     Social History   Socioeconomic History  . Marital status: Married    Spouse name: Not on file  . Number of children: Not on file  . Years of education:  Not on file  . Highest education level: Not on file  Occupational History  . Not on file  Tobacco Use  . Smoking status: Former Smoker  Substance and Sexual Activity  . Alcohol use: Not Currently  . Drug use: Never  . Sexual activity: Not on file  Other Topics Concern  . Not on file  Social History Narrative  . Not on file   Social Determinants of Health   Financial Resource Strain:   . Difficulty of Paying Living Expenses:   Food Insecurity:   . Worried About Programme researcher, broadcasting/film/video in the Last Year:   . Barista in the Last Year:   Transportation Needs:   . Freight forwarder (Medical):   Marland Kitchen Lack of Transportation (Non-Medical):   Physical Activity:   . Days of Exercise per Week:   . Minutes of Exercise per Session:   Stress:   . Feeling of Stress :   Social Connections:   . Frequency of Communication with Friends and Family:   . Frequency of Social Gatherings with Friends and Family:   . Attends Religious Services:   . Active Member of Clubs or Organizations:   . Attends Banker Meetings:   Marland Kitchen Marital Status:   Intimate Partner Violence:   . Fear of Current or Ex-Partner:   . Emotionally Abused:   Marland Kitchen Physically Abused:   . Sexually Abused:     Review of Systems: Positive for diminished appetite, gas pains.  Chronic shortness of breath . All other systems reviewed and negative except where noted in HPI.  Physical Exam: Vital signs in last 24 hours: Temp:  [97.6 F (36.4 C)-98.2 F (36.8 C)] 97.7 F (36.5 C) (08/15 0342) Pulse Rate:  [82-95] 82 (08/15 0342) Resp:  [14-20] 20 (08/15 0342) BP: (124-162)/(70-82) 162/79 (08/15 0342) SpO2:  [98 %-99 %] 98 % (08/15 0901) Last BM Date: 01/02/20 General:   Alert, emaciated male in NAD Psych:  Sl;eepy, pleasant, cooperative. Normal mood and affect. Eyes:  Pupils equal, sclera clear, no icterus.   Conjunctiva pink. Ears:  Normal auditory acuity. Nose:  No deformity, discharge,  or lesions. Neck:   Supple; no masses Lungs:  Severely diminished breath sounds bilaterally. On 02 per [place].  Heart:  Regular rate and rhythm;  no lower extremity edema Abdomen:  Soft, non-distended, nontender, BS active, no palp mass   Rectal:  Deferred  Msk:  Symmetrical without gross deformities. . Neurologic:  Alert and  oriented overall Skin:  Intact without significant lesions or rashes.  Extra Intake/Output from previous day: 08/14 0701 - 08/15 0700 In: 582.2 [I.V.:525; IV Piggyback:57.2] Out: 75 [Urine:75] Intake/Output this shift: No intake/output data recorded.  Lab Results: Recent Labs    01/03/20 2256 01/04/20 0815  WBC 9.6 8.8  HGB 14.0 13.2  HCT 44.7 41.5  PLT 223 222   BMET Recent Labs    01/03/20 2256 01/04/20 0815  NA 137 137  K 5.0 4.6  CL 99 97*  CO2 25 28  GLUCOSE 94 131*  BUN 28* 29*  CREATININE 0.83 0.83  CALCIUM 9.4 9.0   LFT Recent Labs    01/04/20 0815  PROT 5.9*  ALBUMIN 3.5  AST 132*  ALT 293*  ALKPHOS 273*  BILITOT 2.4*   PT/INR No results for input(s): LABPROT, INR in the last 72 hours. Hepatitis Panel No results for input(s): HEPBSAG, HCVAB, HEPAIGM, HEPBIGM in the last 72 hours.   . CBC Latest Ref Rng & Units 01/04/2020 01/03/2020  WBC 4.0 - 10.5 K/uL 8.8  9.6  Hemoglobin 13.0 - 17.0 g/dL 16.1 09.6  Hematocrit 39 - 52 % 41.5 44.7  Platelets 150 - 400 K/uL 222 223    . CMP Latest Ref Rng & Units 01/04/2020 01/03/2020  Glucose 70 - 99 mg/dL 045(W) 94  BUN 8 - 23 mg/dL 09(W) 11(B)  Creatinine 0.61 - 1.24 mg/dL 1.47 8.29  Sodium 562 - 145 mmol/L 137 137  Potassium 3.5 - 5.1 mmol/L 4.6 5.0  Chloride 98 - 111 mmol/L 97(L) 99  CO2 22 - 32 mmol/L 28 25  Calcium 8.9 - 10.3 mg/dL 9.0 9.4  Total Protein 6.5 - 8.1 g/dL 5.9(L) 6.4(L)  Total Bilirubin 0.3 - 1.2 mg/dL 2.4(H) 3.4(H)  Alkaline Phos 38 - 126 U/L 273(H) 307(H)  AST 15 - 41 U/L 132(H) 143(H)  ALT 0 - 44 U/L 293(H) 311(H)   Studies/Results: No results found.  Active Problems:    Pancreatitis, acute   Steroid-dependent COPD (HCC)   BPH (benign prostatic hyperplasia)   Chronic pulmonary aspiration    Willette Cluster, NP-C @  01/04/2020, 10:53 AM

## 2020-01-04 NOTE — Evaluation (Signed)
Occupational Therapy Evaluation Patient Details Name: Shawn Davis MRN: [id number] DOB: 02-02-1936 Today's Date: 01/04/2020    History of Present Illness 84 y.o. male with medical history significant of steroid dependent COPD, h/o aspiration and aspiration pneumonia, BPH, protein calorie malnutrition depression admitted to [facility] 8/14 for acute pancreatitis. Recent hx of failure to thrive and weight loss.   Clinical Impression   [name] is an 84 year old man admitted to [facility] hospital for acute pancreatitis with family reporting recent failure to thrive and weight loss. On evaluation patient presents with normal ROM and strength of upper extremities with MMT but overall generalized weakness and decreased activity tolerance. Patient mildly dyspneic during activity on 2L Trainer and patient placed [place] in mouth to assist with recovery. O2 sats WNL beween 98-100%. Patient required min assist for mobility and max assist for lower body bathing and toileting after incontinent episode. Patient pleasant and mostly oriented with deficits of memory. Patient will benefit from skilled OT services to improve deficits and improved functional abilities in order to return home at discharge. Patient receiving HH services prior to admission and therapist recommends resuming therapy at discharge.    Follow Up Recommendations  Home health OT    Equipment Recommendations  None recommended by OT    Recommendations for Other Services       Precautions / Restrictions Precautions Precautions: Fall Restrictions Weight Bearing Restrictions: No      Mobility Bed Mobility Overal bed mobility: Needs Assistance Bed Mobility: Supine to Sit     Supine to sit: Min assist        Transfers Overall transfer level: Needs assistance Equipment used: Rolling walker (2 wheeled) Transfers: Sit to/from [employer] Sit to Stand: Min assist Stand pivot transfers: Min assist            Balance  Overall balance assessment: Needs assistance Sitting-balance support: No upper extremity supported;Feet supported Sitting balance-Leahy Scale: Good     Standing balance support: Bilateral upper extremity supported;During functional activity Standing balance-Leahy Scale: Poor Standing balance comment: reliance on upper extremities                           ADL either performed or assessed with clinical judgement   ADL Overall ADL's : Needs assistance/impaired Eating/Feeding: Independent   Grooming: Sitting;Wash/dry hands;Wash/dry face   Upper Body Bathing: Sitting;Set up   Lower Body Bathing: Maximal assistance;Sit to/from stand   Upper Body Dressing : Set up;Sitting   Lower Body Dressing: Sit to/from stand;Moderate assistance   Toilet Transfer: BSC;Stand-pivot;Minimal assistance   Toileting- Clothing Manipulation and Hygiene: Maximal assistance;Sit to/from stand       Functional mobility during ADLs: Minimal assistance;Rolling walker       Vision   Vision Assessment?: No apparent visual deficits     Perception     Praxis      Pertinent Vitals/Pain Pain Assessment: No/denies pain     Hand Dominance Right   Extremity/Trunk Assessment Upper Extremity Assessment Upper Extremity Assessment: Overall WFL for tasks assessed;RUE deficits/detail;LUE deficits/detail RUE Deficits / Details: 5/5 strength throughout, some loss of shoulder ROM LUE Deficits / Details: 5/5 strength throughout, some loss of shoulder ROM   Lower Extremity Assessment Lower Extremity Assessment: Defer to PT evaluation   Cervical / Trunk Assessment Cervical / Trunk Assessment: Kyphotic   Communication Communication Communication: No difficulties   Cognition Arousal/Alertness: Awake/alert Behavior During Therapy: WFL for tasks assessed/performed Overall Cognitive Status:  History of cognitive impairments - at baseline                                 General  Comments: Daughter reports impaired short term memory recently.   General Comments       Exercises     Shoulder Instructions      Home Living Family/patient expects to be discharged to:: Private residence Living Arrangements: Spouse/significant other Available Help at Discharge: Family;Available 24 hours/day Type of Home: House Home Access: Stairs to enter     Home Layout: One level               Home Equipment: Environmental consultant - 2 wheels   Additional Comments: Patient lives at home with his wife. Has been receiving HH OT/PT. ON 2-3L oxygen at home.      Prior Functioning/Environment Level of Independence: Independent                 OT Problem List: Decreased activity tolerance;Impaired balance (sitting and/or standing);Decreased safety awareness;Decreased knowledge of use of DME or AE;Decreased cognition      OT Treatment/Interventions: Self-care/ADL training;Therapeutic exercise;DME and/or AE instruction;Therapeutic activities;Patient/family education    OT Goals(Current goals can be found in the care plan section) Acute Rehab OT Goals Patient Stated Goal: Did not state OT Goal Formulation: With patient Time For Goal Achievement: 01/18/20 Potential to Achieve Goals: Good  OT Frequency: Min 2X/week   Barriers to D/C:            Co-evaluation PT/OT/SLP Co-Evaluation/Treatment: Yes   PT goals addressed during session: Mobility/safety with mobility OT goals addressed during session: ADL's and self-care      AM-PAC OT "6 Clicks" Daily Activity     Outcome Measure Help from another person eating meals?: None Help from another person taking care of personal grooming?: A Little Help from another person toileting, which includes using toliet, bedpan, or urinal?: A Lot Help from another person bathing (including washing, rinsing, drying)?: A Lot Help from another person to put on and taking off regular upper body clothing?: A Little Help from another person to  put on and taking off regular lower body clothing?: A Lot 6 Click Score: 16   End of Session Equipment Utilized During Treatment: Gait belt;Rolling walker Nurse Communication: Mobility status  Activity Tolerance: Patient tolerated treatment well Patient left: in chair;with call bell/phone within reach;with chair alarm set;with family/visitor present  OT Visit Diagnosis: Unsteadiness on feet (R26.81);Adult, failure to thrive (R62.7)                Time: 2355-7322 OT Time Calculation (min): 25 min Charges:  OT General Charges $OT Visit: 1 Visit OT Evaluation $OT Eval Low Complexity: 1 Low  Khalif Stender, OTR/L Acute Care Rehab Services  Office [phone] Pager: [phone]   Kelli Churn 01/04/2020, 1:10 PM

## 2020-01-04 NOTE — H&P (View-Only) (Signed)
 Referring Provider:  [facility]         Primary Care Physician:  Butler, Amy E, MD Primary Gastroenterologist: Unassigned.           We were asked to see this patient for:   Bile duct stones               ASSESSMENT /  PLAN    CC: Bile duct stones  Shawn Davis is a 84 y.o. male PMH significant for, but not necessarily limited to, severe COPD, aspiration pneumonia, BPH                                                                                                                                    #Abnormal LFTs/biliary duct dilation on CT scan done at [place] hospital --Patient has his gallbladder intact with calculi present --Patient is at high risk for endoscopic procedure given severe COPD.  Plan is for MRCP today and depending on results he will likely need ERCP with stone extraction tomorrow.  The benefits and risk of ERCP including perforation, infection, pancreatitis, and complications of sedation were discussed with the patient and his daughter.  Patient agrees to proceed with ERCP should if be necessary.  --If turns out to have choledocholithiasis then will need surgical consult for consideration of cholecystectomy  #  Malnourishment / recent weight loss.    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    HPI:    Chief Complaint: None.   Shawn Davis is a 84 y.o. male with severe COPD who was transferred here from a hospital in [place].  Patient's daughter provides most of the history.  She believes it was on Thursday that patient was taken to the hospital in [place] for weight loss.   Reviewed paper records from [facility].  CBC 6.1, hemoglobin 12.1, MCV 99, platelets 190.  Lipase normal at 130. Patient found to have markedly elevated LFTs.  CT scan of the abdomen and pelvis with contrast showed severe intrahepatic and proximal extrahepatic biliary dilation with scattered areas of intermediate density material especially within the left  hepatic duct possibly reflecting biliary sludge.  The gallbladder was incompletely distended containing less than 1 cm calculi.  Pancreas was atrophic.  Spleen unremarkable.  Moderate right greater than left hydronephrosis.  No definite nephrolithiasis.  Moderate fecal retention.  Mild colonic diverticulosis. Normal appendix.  According to the daughter, patient has not been having any complaints of abdominal pain, nausea nor vomiting.  Patient lives at home with his wife but daughter visits frequently.  She says his appetite has diminished in the recent past.  No known family history of colon cancer or pancreatic cancer.  Daughter does not think patient has any chronic GI problems. Patient himself denies abdominal pain just occasional gas discomfort in the abdomen.  Patient oriented overall.  Patient knows he is in the hospital but did not realize   he had been transferred from [facility] and today his liver test show improvement.  AST down to 132, ALT 293.  Alkaline phosphatase 273.  White count remains normal.  Lipase has been normal this admission   Past Medical History:  Diagnosis Date  . Aspiration pneumonia (HCC)   . Aspiration, chronic pulmonary   . BPH (benign prostatic hyperplasia)   . COPD (chronic obstructive pulmonary disease) (HCC)   . HLD (hyperlipidemia)   . Protein calorie malnutrition (HCC)   . Pulmonary nodule, right     History reviewed. No pertinent surgical history.  Prior to Admission medications   Medication Sig Start Date End Date Taking? Authorizing Provider  albuterol (VENTOLIN HFA) 108 (90 Base) MCG/ACT inhaler Inhale 2 puffs into the lungs every 6 (six) hours as needed for shortness of breath or wheezing. 12/08/19  Yes [provider]  finasteride (PROSCAR) 5 MG tablet Take 5 mg by mouth daily. 12/08/19  Yes [provider]  FLUoxetine (PROZAC) 40 MG capsule Take 40 mg by mouth daily. 12/08/19  Yes [provider]  STRIVERDI RESPIMAT  2.5 MCG/ACT AERS Inhale 2 puffs into the lungs daily. 12/08/19  Yes [provider]  tamsulosin (FLOMAX) 0.4 MG CAPS capsule Take 0.4 mg by mouth daily. 12/08/19  Yes [provider]    Current Facility-Administered Medications  Medication Dose Route Frequency Provider Last Rate Last Admin  . acetaminophen (TYLENOL) tablet 650 mg  650 mg Oral Q6H PRN Norins, Rosalyn Gess, MD       Or  . acetaminophen (TYLENOL) suppository 650 mg  650 mg Rectal Q6H PRN Norins, Rosalyn Gess, MD      . albuterol (PROVENTIL) (2.5 MG/3ML) 0.083% nebulizer solution 2.5 mg  2.5 mg Nebulization Q6H PRN Norins, Rosalyn Gess, MD   2.5 mg at 01/03/20 2342  . arformoterol (BROVANA) nebulizer solution 15 mcg  15 mcg Nebulization BID Jacques Navy, MD   15 mcg at 01/04/20 0901  . dextrose 5 %-0.45 % sodium chloride infusion   Intravenous Continuous Norins, Rosalyn Gess, MD 75 mL/hr at 01/03/20 2259 New Bag at 01/03/20 2259  . finasteride (PROSCAR) tablet 5 mg  5 mg Oral Daily Norins, Rosalyn Gess, MD   5 mg at 01/04/20 1002  . FLUoxetine (PROZAC) capsule 40 mg  40 mg Oral Daily Norins, Rosalyn Gess, MD   40 mg at 01/04/20 0959  . oxyCODONE (Oxy IR/ROXICODONE) immediate release tablet 5 mg  5 mg Oral Q4H PRN Norins, Rosalyn Gess, MD      . piperacillin-tazobactam (ZOSYN) IVPB 3.375 g  3.375 g Intravenous Q8H Norins, Rosalyn Gess, MD 12.5 mL/hr at 01/04/20 0525 3.375 g at 01/04/20 0525  . senna (SENOKOT) tablet 8.6 mg  1 tablet Oral BID Jacques Navy, MD   8.6 mg at 01/04/20 0959  . tamsulosin (FLOMAX) capsule 0.4 mg  0.4 mg Oral Daily Norins, Rosalyn Gess, MD   0.4 mg at 01/04/20 1012    Allergies as of 01/02/2020  . (Not on File)    Family History  Problem Relation Age of Onset  . Pancreatic cancer Mother   . CAD Mother   . Lung cancer Father   . CAD Brother     Social History   Socioeconomic History  . Marital status: Married    Spouse name: Not on file  . Number of children: Not on file  . Years of education:  Not on file  . Highest education level: Not on file  Occupational History  . Not on file  Tobacco Use  . Smoking status: Former Smoker  Substance and Sexual Activity  . Alcohol use: Not Currently  . Drug use: Never  . Sexual activity: Not on file  Other Topics Concern  . Not on file  Social History Narrative  . Not on file   Social Determinants of Health   Financial Resource Strain:   . Difficulty of Paying Living Expenses:   Food Insecurity:   . Worried About Programme researcher, broadcasting/film/video in the Last Year:   . Barista in the Last Year:   Transportation Needs:   . Freight forwarder (Medical):   Marland Kitchen Lack of Transportation (Non-Medical):   Physical Activity:   . Days of Exercise per Week:   . Minutes of Exercise per Session:   Stress:   . Feeling of Stress :   Social Connections:   . Frequency of Communication with Friends and Family:   . Frequency of Social Gatherings with Friends and Family:   . Attends Religious Services:   . Active Member of Clubs or Organizations:   . Attends Banker Meetings:   Marland Kitchen Marital Status:   Intimate Partner Violence:   . Fear of Current or Ex-Partner:   . Emotionally Abused:   Marland Kitchen Physically Abused:   . Sexually Abused:     Review of Systems: Positive for diminished appetite, gas pains.  Chronic shortness of breath . All other systems reviewed and negative except where noted in HPI.  Physical Exam: Vital signs in last 24 hours: Temp:  [97.6 F (36.4 C)-98.2 F (36.8 C)] 97.7 F (36.5 C) (08/15 0342) Pulse Rate:  [82-95] 82 (08/15 0342) Resp:  [14-20] 20 (08/15 0342) BP: (124-162)/(70-82) 162/79 (08/15 0342) SpO2:  [98 %-99 %] 98 % (08/15 0901) Last BM Date: 01/02/20 General:   Alert, emaciated male in NAD Psych:  Sl;eepy, pleasant, cooperative. Normal mood and affect. Eyes:  Pupils equal, sclera clear, no icterus.   Conjunctiva pink. Ears:  Normal auditory acuity. Nose:  No deformity, discharge,  or lesions. Neck:   Supple; no masses Lungs:  Severely diminished breath sounds bilaterally. On 02 per [place].  Heart:  Regular rate and rhythm;  no lower extremity edema Abdomen:  Soft, non-distended, nontender, BS active, no palp mass   Rectal:  Deferred  Msk:  Symmetrical without gross deformities. . Neurologic:  Alert and  oriented overall Skin:  Intact without significant lesions or rashes.  Extra Intake/Output from previous day: 08/14 0701 - 08/15 0700 In: 582.2 [I.V.:525; IV Piggyback:57.2] Out: 75 [Urine:75] Intake/Output this shift: No intake/output data recorded.  Lab Results: Recent Labs    01/03/20 2256 01/04/20 0815  WBC 9.6 8.8  HGB 14.0 13.2  HCT 44.7 41.5  PLT 223 222   BMET Recent Labs    01/03/20 2256 01/04/20 0815  NA 137 137  K 5.0 4.6  CL 99 97*  CO2 25 28  GLUCOSE 94 131*  BUN 28* 29*  CREATININE 0.83 0.83  CALCIUM 9.4 9.0   LFT Recent Labs    01/04/20 0815  PROT 5.9*  ALBUMIN 3.5  AST 132*  ALT 293*  ALKPHOS 273*  BILITOT 2.4*   PT/INR No results for input(s): LABPROT, INR in the last 72 hours. Hepatitis Panel No results for input(s): HEPBSAG, HCVAB, HEPAIGM, HEPBIGM in the last 72 hours.   . CBC Latest Ref Rng & Units 01/04/2020 01/03/2020  WBC 4.0 - 10.5 K/uL 8.8  9.6  Hemoglobin 13.0 - 17.0 g/dL 16.1 09.6  Hematocrit 39 - 52 % 41.5 44.7  Platelets 150 - 400 K/uL 222 223    . CMP Latest Ref Rng & Units 01/04/2020 01/03/2020  Glucose 70 - 99 mg/dL 045(W) 94  BUN 8 - 23 mg/dL 09(W) 11(B)  Creatinine 0.61 - 1.24 mg/dL 1.47 8.29  Sodium 562 - 145 mmol/L 137 137  Potassium 3.5 - 5.1 mmol/L 4.6 5.0  Chloride 98 - 111 mmol/L 97(L) 99  CO2 22 - 32 mmol/L 28 25  Calcium 8.9 - 10.3 mg/dL 9.0 9.4  Total Protein 6.5 - 8.1 g/dL 5.9(L) 6.4(L)  Total Bilirubin 0.3 - 1.2 mg/dL 2.4(H) 3.4(H)  Alkaline Phos 38 - 126 U/L 273(H) 307(H)  AST 15 - 41 U/L 132(H) 143(H)  ALT 0 - 44 U/L 293(H) 311(H)   Studies/Results: No results found.  Active Problems:    Pancreatitis, acute   Steroid-dependent COPD (HCC)   BPH (benign prostatic hyperplasia)   Chronic pulmonary aspiration    Willette Cluster, NP-C @  01/04/2020, 10:53 AM

## 2020-01-05 ENCOUNTER — Inpatient Hospital Stay (HOSPITAL_COMMUNITY): Payer: Medicare Other

## 2020-01-05 ENCOUNTER — Inpatient Hospital Stay (HOSPITAL_COMMUNITY): Payer: Medicare Other | Admitting: Certified Registered Nurse Anesthetist

## 2020-01-05 ENCOUNTER — Encounter (HOSPITAL_COMMUNITY)
Admission: AD | Disposition: A | Discharge status: Skilled Nursing Facility | Payer: Self-pay | Source: Other Acute Inpatient Hospital | Attending: Internal Medicine

## 2020-01-05 ENCOUNTER — Encounter (HOSPITAL_COMMUNITY): Payer: Self-pay | Admitting: Internal Medicine

## 2020-01-05 DIAGNOSIS — E43 Unspecified severe protein-calorie malnutrition: Secondary | ICD-10-CM | POA: Insufficient documentation

## 2020-01-05 HISTORY — PX: ERCP: SHX5425

## 2020-01-05 HISTORY — PX: REMOVAL OF STONES: SHX5545

## 2020-01-05 HISTORY — PX: SPHINCTEROTOMY: SHX5279

## 2020-01-05 HISTORY — PX: PANCREATIC STENT PLACEMENT: SHX5539

## 2020-01-05 HISTORY — PX: ESOPHAGOGASTRODUODENOSCOPY: SHX5428

## 2020-01-05 HISTORY — PX: BILIARY BRUSHING: SHX6843

## 2020-01-05 HISTORY — PX: BILIARY DILATION: SHX6850

## 2020-01-05 HISTORY — PX: BILIARY STENT PLACEMENT: SHX5538

## 2020-01-05 HISTORY — PX: BIOPSY: SHX5522

## 2020-01-05 LAB — COMPREHENSIVE METABOLIC PANEL
ALT: 255 U/L — ABNORMAL HIGH (ref 0–44)
AST: 121 U/L — ABNORMAL HIGH (ref 15–41)
Albumin: 3.2 g/dL — ABNORMAL LOW (ref 3.5–5.0)
Alkaline Phosphatase: 223 U/L — ABNORMAL HIGH (ref 38–126)
Anion gap: 9 (ref 5–15)
BUN: 22 mg/dL (ref 8–23)
CO2: 30 mmol/L (ref 22–32)
Calcium: 8.9 mg/dL (ref 8.9–10.3)
Chloride: 97 mmol/L — ABNORMAL LOW (ref 98–111)
Creatinine, Ser: 0.63 mg/dL (ref 0.61–1.24)
GFR calc Af Amer: >60 mL/min (ref >60)
GFR calc non Af Amer: >60 mL/min (ref >60)
Glucose, Bld: 124 mg/dL — ABNORMAL HIGH (ref 70–99)
Potassium: 3.7 mmol/L (ref 3.5–5.1)
Sodium: 136 mmol/L (ref 135–145)
Total Bilirubin: 2.3 mg/dL — ABNORMAL HIGH (ref 0.3–1.2)
Total Protein: 5.3 g/dL — ABNORMAL LOW (ref 6.5–8.1)

## 2020-01-05 LAB — CBC
HCT: 35.1 % — ABNORMAL LOW (ref 39.0–52.0)
Hemoglobin: 11.5 g/dL — ABNORMAL LOW (ref 13.0–17.0)
MCH: 32.2 pg (ref 26.0–34.0)
MCHC: 32.8 g/dL (ref 30.0–36.0)
MCV: 98.3 fL (ref 80.0–100.0)
Platelets: 170 10*3/uL (ref 150–400)
RBC: 3.57 MIL/uL — ABNORMAL LOW (ref 4.22–5.81)
RDW: 14.2 % (ref 11.5–15.5)
WBC: 5 10*3/uL (ref 4.0–10.5)
nRBC: 0 % (ref 0.0–0.2)

## 2020-01-05 SURGERY — ERCP, WITH INTERVENTION IF INDICATED
Anesthesia: General

## 2020-01-05 MED ORDER — LIDOCAINE 2% (20 MG/ML) 5 ML SYRINGE
INTRAMUSCULAR | Status: DC | PRN
  Administered 2020-01-05: 80 mg via INTRAVENOUS

## 2020-01-05 MED ORDER — PROPOFOL 500 MG/50ML IV EMUL
INTRAVENOUS | Status: AC
  Filled 2020-01-05: qty 50

## 2020-01-05 MED ORDER — SODIUM CHLORIDE 0.9 % IV SOLN: INTRAVENOUS | Status: DC

## 2020-01-05 MED ORDER — DEXAMETHASONE SODIUM PHOSPHATE 10 MG/ML IJ SOLN
INTRAMUSCULAR | Status: DC | PRN
  Administered 2020-01-05: 5 mg via INTRAVENOUS

## 2020-01-05 MED ORDER — GLUCAGON HCL RDNA (DIAGNOSTIC) 1 MG IJ SOLR
INTRAMUSCULAR | Status: AC
  Filled 2020-01-05: qty 1

## 2020-01-05 MED ORDER — PHENYLEPHRINE 40 MCG/ML (10ML) SYRINGE FOR IV PUSH (FOR BLOOD PRESSURE SUPPORT)
PREFILLED_SYRINGE | INTRAVENOUS | Status: DC | PRN
  Administered 2020-01-05: 120 ug via INTRAVENOUS
  Administered 2020-01-05: 80 ug via INTRAVENOUS

## 2020-01-05 MED ORDER — PROPOFOL 10 MG/ML IV BOLUS
INTRAVENOUS | Status: DC | PRN
  Administered 2020-01-05: 150 mg via INTRAVENOUS

## 2020-01-05 MED ORDER — INDOMETHACIN 50 MG RE SUPP
RECTAL | Status: DC | PRN
  Administered 2020-01-05: 100 mg via RECTAL

## 2020-01-05 MED ORDER — FENTANYL CITRATE (PF) 100 MCG/2ML IJ SOLN
INTRAMUSCULAR | Status: AC
  Filled 2020-01-05: qty 2

## 2020-01-05 MED ORDER — ONDANSETRON HCL 4 MG/2ML IJ SOLN
INTRAMUSCULAR | Status: DC | PRN
  Administered 2020-01-05: 4 mg via INTRAVENOUS

## 2020-01-05 MED ORDER — SUCCINYLCHOLINE CHLORIDE 200 MG/10ML IV SOSY
PREFILLED_SYRINGE | INTRAVENOUS | Status: DC | PRN
  Administered 2020-01-05: 120 mg via INTRAVENOUS

## 2020-01-05 MED ORDER — EPHEDRINE SULFATE-NACL 50-0.9 MG/10ML-% IV SOSY
PREFILLED_SYRINGE | INTRAVENOUS | Status: DC | PRN
  Administered 2020-01-05 (×2): 10 mg via INTRAVENOUS

## 2020-01-05 MED ORDER — GLUCAGON HCL RDNA (DIAGNOSTIC) 1 MG IJ SOLR
INTRAMUSCULAR | Status: DC | PRN
Start: 2020-01-05 — End: 2020-01-05
  Administered 2020-01-05 (×4): .25 mg via INTRAVENOUS

## 2020-01-05 MED ORDER — SODIUM CHLORIDE 0.9 % IV SOLN
INTRAVENOUS | Status: DC | PRN
  Administered 2020-01-05: 65 mL

## 2020-01-05 MED ORDER — PHENYLEPHRINE HCL-NACL 10-0.9 MG/250ML-% IV SOLN
INTRAVENOUS | Status: DC | PRN
  Administered 2020-01-05: 50 ug/min via INTRAVENOUS

## 2020-01-05 MED ORDER — FENTANYL CITRATE (PF) 100 MCG/2ML IJ SOLN
INTRAMUSCULAR | Status: DC | PRN
  Administered 2020-01-05 (×2): 25 ug via INTRAVENOUS

## 2020-01-05 MED ORDER — LACTATED RINGERS IV SOLN
INTRAVENOUS | Status: DC
  Administered 2020-01-05: 1000 mL via INTRAVENOUS

## 2020-01-05 MED ORDER — INDOMETHACIN 50 MG RE SUPP
RECTAL | Status: AC
  Filled 2020-01-05: qty 2

## 2020-01-05 MED ORDER — GADOBUTROL 1 MMOL/ML IV SOLN
5.0000 mL | Freq: Once | INTRAVENOUS | Status: AC | PRN
  Administered 2020-01-05: 5 mL via INTRAVENOUS

## 2020-01-05 MED ORDER — PROPOFOL 10 MG/ML IV BOLUS
INTRAVENOUS | Status: AC
  Filled 2020-01-05: qty 20

## 2020-01-05 NOTE — Progress Notes (Signed)
Dr. Donavan Foil notified that pt appears to be going in and out of afib, no new orders obtained, see flowsheet for rate and bp, no signs of distress or pain, safety maintained

## 2020-01-05 NOTE — Progress Notes (Signed)
PROGRESS NOTE    Shawn Davis  ZOX:[id number] DOB: 12/20/1935 DOA: 01/03/2020 PCP: Marlise EvesButler, Amy E, MD   Chief complaint: FTT  Brief Narrative: 84 y.o. male with medical history significant of steroid dependent COPD, h/o aspiration and aspiration pneumonia, BPH, protein calorie malnutrition depression who has had rapid weight loss and failure to thrive and sent to the ED at [facility] 01/01/20 where work-up showed CT scan showing intra-/extrahepatic duct dilatation diagnosing acute pancreatitis and transferred to [facility] 01/03/20.   Subjective:  Afebrile overnight Seen this morning after MRI.  Wife at the bedside.  Has no new complaints.  No nausea vomiting abdominal pain or shortness of breath. lfts up but stable ast/alt/alp- 121/255/223 TB 2.3  Assessment & Plan:  Abnormal LFTs with dilated biliary tree at outside facility diagnosed as pancreatitis:MRCP suspecting degree of intrahepatic biliary dilatation, questionable filling defects at the confluence of the right and left hepatic ducts, third spacing of fluid with trace ascites, mesenteric edema and subcutaneous edema.  Await GI input for further ERCP.Hepatitis panel negative.  LFTs are up but stable, lipase normal.  No pain or discomfort. Keep on IV fluids empiric antibiotics.  Failure to thrive/rapid weight loss:poor oral intake for few months, with chronic medical condition.  MRI as above, dietitian consulted on board.  He is followed by dietitian at home.   COPD/Chronic hypoxic respiratory failure on 2-3 l [place].per daughter "He has trouble breathing all the time" and has had recent hospitalization with subsequent decline failure to thrive: Continue inhaler.  WJX:BJYNWGNFBPH:Continue Flomax/finasteride.  Chronic pulmonary aspiration history followed by speech and nutrition at home.  Underweight severe protein calorie malnutrition- augment diet  DVT prophylaxis:SCD and consider prophylaxis if no procedure Code  Status:   Code Status: DNR  Family Communication: plan of care discussed with patient and his wife at the bedside.  Daughter was updated yesterday in prefers to be communicated regarding plan by GI.  Status is: Inpatient Remains inpatient appropriate because:Ongoing diagnostic testing needed not appropriate for outpatient work up and IV treatments appropriate due to intensity of illness or inability to take PO Dispo:The patient is from: Home            Anticipated d/c is to: Home            Anticipated d/c date is: 2 days            Patient currently is not medically stable to d/c.   Nutrition: Diet Order            Diet NPO time specified  Diet effective now                 Body mass index is 15.07 kg/m.  Consultants:see note  Procedures:see note Microbiology:see note Blood Culture No results found for: SDES, SPECREQUEST, CULT, REPTSTATUS  Other culture-see note  Medications: Scheduled Meds: . [MAR Hold] arformoterol  15 mcg Nebulization BID  . [MAR Hold] finasteride  5 mg Oral Daily  . [MAR Hold] FLUoxetine  40 mg Oral Daily  . [MAR Hold] senna  1 tablet Oral BID  . [MAR Hold] tamsulosin  0.4 mg Oral Daily   Continuous Infusions: . sodium chloride    . lactated ringers 125 mL/hr at 01/05/20 1308  . [MAR Hold] piperacillin-tazobactam (ZOSYN)  IV 3.375 g (01/05/20 0539)    Antimicrobials: Anti-infectives (From admission, onward)   Start     Dose/Rate Route Frequency Ordered Stop   01/04/20 0000  piperacillin-tazobactam (ZOSYN)  IVPB 3.375 g  Status:  Discontinued        3.375 g 100 mL/hr over 30 Minutes Intravenous Every 6 hours 01/03/20 2230 01/03/20 2235   01/03/20 2245  [MAR Hold]  piperacillin-tazobactam (ZOSYN) IVPB 3.375 g     Discontinue     (MAR Hold since Mon 01/05/2020 at 1221.Hold Reason: Transfer to a Procedural area.)   3.375 g 12.5 mL/hr over 240 Minutes Intravenous Every 8 hours 01/03/20 2235         Objective: Vitals: Today's Vitals   01/05/20  0636 01/05/20 0935 01/05/20 1111 01/05/20 1236  BP: (!) 147/94   137/83  Pulse: 82   83  Resp: 18   (!) 23  Temp: 97.6 F (36.4 C)   97.9 F (36.6 C)  TempSrc: Oral   Oral  SpO2: 100%  92% 100%  Weight:    47.6 kg  Height:     (1.778 m)  PainSc:  0-No pain  0-No pain    Intake/Output Summary (Last 24 hours) at 01/05/2020 1421 Last data filed at 01/05/2020 1322 Gross per 24 hour  Intake 144.13 ml  Output 500 ml  Net -355.87 ml   Filed Weights   01/05/20 1236  Weight: 47.6 kg   Weight change:    Intake/Output from previous day: 08/15 0701 - 08/16 0700 In: 204.1 [I.V.:61.2; IV Piggyback:142.9] Out: 300 [Urine:300] Intake/Output this shift: Total I/O In: 50 [IV Piggyback:50] Out: 200 [Urine:200]  Examination:  General exam: AAOx3 , NAD, weak appearing. HEENT:Oral mucosa moist, Ear/Nose WNL grossly, dentition normal. Respiratory system: bilaterally diminished breath sounds but no wheezing, no use of accessory muscle Cardiovascular system:S1 & S2 +, No JVD,. Gastrointestinal system:Abdomen is scaphoid, soft, NT,ND, BS+ Nervous System:Alert,awake, moving extremities and grossly nonfocal Extremities: No edema,distal peripheral pulses palpable.  Skin:No rashes,no icterus. ZOX:WRUEAV muscle bulk,tone, power  Data Reviewed: I have personally reviewed following labs and imaging studies CBC: Recent Labs  Lab 01/03/20 2256 01/04/20 0815 01/05/20 0501  WBC 9.6 8.8 5.0  NEUTROABS 8.8*  --   --   HGB 14.0 13.2 11.5*  HCT 44.7 41.5 35.1*  MCV 100.7* 101.2* 98.3  PLT 223 222 170   Basic Metabolic Panel: Recent Labs  Lab 01/03/20 2256 01/04/20 0815 01/05/20 0501  NA 137 137 136  K 5.0 4.6 3.7  CL 99 97* 97*  CO2 GLUCOSE 94 131* 124*  BUN 28* 29* 22  CREATININE 0.83 0.83 0.63  CALCIUM 9.4 9.0 8.9  MG  --  2.0  --    GFR: Estimated Creatinine Clearance: 47.9 mL/min (by C-G formula based on SCr of 0.63 mg/dL). Liver Function Tests: Recent Labs    Lab 01/03/20 2256 01/04/20 0815 01/05/20 0501  AST 143* 132* 121*  ALT 311* 293* 255*  ALKPHOS 307* 273* 223*  BILITOT 3.4* 2.4* 2.3*  PROT 6.4* 5.9* 5.3*  ALBUMIN 3.7 3.5 3.2*   Recent Labs  Lab 01/03/20 2256 01/04/20 0815  LIPASE 18 22  AMYLASE 48  --    No results for input(s): AMMONIA in the last 168 hours. Coagulation Profile: No results for input(s): INR, PROTIME in the last 168 hours. Cardiac Enzymes: No results for input(s): CKTOTAL, CKMB, CKMBINDEX, TROPONINI in the last 168 hours. BNP (last 3 results) No results for input(s): PROBNP in the last 8760 hours. HbA1C: No results for input(s): HGBA1C in the last 72 hours. CBG: No results for input(s): GLUCAP in the last 168 hours. Lipid Profile: No  results for input(s): CHOL, HDL, LDLCALC, TRIG, CHOLHDL, LDLDIRECT in the last 72 hours. Thyroid Function Tests: No results for input(s): TSH, T4TOTAL, FREET4, T3FREE, THYROIDAB in the last 72 hours. Anemia Panel: No results for input(s): VITAMINB12, FOLATE, FERRITIN, TIBC, IRON, RETICCTPCT in the last 72 hours. Sepsis Labs: No results for input(s): PROCALCITON, LATICACIDVEN in the last 168 hours.  Recent Results (from the past 240 hour(s))  SARS Coronavirus 2 by RT PCR (hospital order, performed in [facility] hospital lab) Nasopharyngeal Nasopharyngeal Swab     Status: None   Collection Time: 01/04/20 11:05 AM   Specimen: Nasopharyngeal Swab  Result Value Ref Range Status   SARS Coronavirus 2 NEGATIVE NEGATIVE Final    Comment: (NOTE) SARS-CoV-2 target nucleic acids are NOT DETECTED.  The SARS-CoV-2 RNA is generally detectable in upper and lower respiratory specimens during the acute phase of infection. The lowest concentration of SARS-CoV-2 viral copies this assay can detect is 250 copies / mL. A negative result does not preclude SARS-CoV-2 infection and should not be used as the sole basis for treatment or other patient management decisions.  A negative result  may occur with improper specimen collection / handling, submission of specimen other than nasopharyngeal swab, presence of viral mutation(s) within the areas targeted by this assay, and inadequate number of viral copies (<250 copies / mL). A negative result must be combined with clinical observations, patient history, and epidemiological information.  Fact Sheet for Patients:   BoilerBrush.com.cy  Fact Sheet for Healthcare Providers: https://pope.com/  This test is not yet approved or  cleared by the [place] FDA and has been authorized for detection and/or diagnosis of SARS-CoV-2 by FDA under an Emergency Use Authorization (EUA).  This EUA will remain in effect (meaning this test can be used) for the duration of the COVID-19 declaration under Section 564(b)(1) of the Act, 21 U.S.C. section 360bbb-3(b)(1), unless the authorization is terminated or revoked sooner.  Performed at [facility], [address], [place], [place] [address]       Radiology Studies: MR 3D Recon At Scanner  Result Date: 01/05/2020 CLINICAL DATA:  Pancreatitis EXAM: MRI ABDOMEN WITHOUT AND WITH CONTRAST (INCLUDING MRCP) TECHNIQUE: Multiplanar multisequence MR imaging of the abdomen was performed both before and after the administration of intravenous contrast. Heavily T2-weighted images of the biliary and pancreatic ducts were obtained, and three-dimensional MRCP images were rendered by post processing. CONTRAST:  46mL GADAVIST GADOBUTROL 1 MMOL/ML IV SOLN COMPARISON:  None. FINDINGS: Despite efforts by the technologist and patient, motion artifact is present on today's exam and could not be eliminated. This reduces exam sensitivity and specificity. Lower chest: Subsegmental atelectasis in the posterior basal segments of both lower lobes. Hepatobiliary: Prominent intrahepatic biliary dilatation. In the confluence of the right and left hepatic  ducts, there is a suggestion of vague filling defects within the biliary tree, for example on image 20/6, without definite enhancement. This could represent sludge, blood products, tumor, stricture/infection, or flow related artifact. This is followed by a mildly dilated segment of the common hepatic duct at 1.1 cm, which tapers distally and in the common bile duct as shown on images 19 through 20 of series 6 in a conical fashion towards the ampulla. I do not see a distal filling defect in the common bile duct which is of more normal caliber. Aside from the clearly abnormal biliary tree, no specific individual parenchymal mass is identified Pancreas: Normal caliber and course of the dorsal pancreatic duct. No findings  of pancreatic pseudocyst or abscess. Subject to limitations by the motion artifact, no obvious pancreatic mass is identified. There is low-grade edema tracking along the upper abdominal mesentery but this is commensurate to the subcutaneous edema and appears more generalized, and accordingly we do not demonstrate specific imaging findings of acute pancreatitis. Spleen:  Unremarkable Adrenals/Urinary Tract: Right extrarenal pelvis without hydronephrosis. Adrenal glands unremarkable. Stomach/Bowel: Borderline prominence of colon caliber, constipation not excluded. No dilated small bowel in the visualized region. Vascular/Lymphatic: There is evidence of abdominal aortic atherosclerosis along with potentially substantial atherosclerotic narrowing of the proximal right common iliac artery. Other: Diffuse subcutaneous edema with generalized edema along the mesentery and tracking along the paraspinal musculature. Trace perihepatic and perisplenic ascites. Musculoskeletal: Mild dextroconvex scoliosis in the vicinity of the thoracolumbar junction. Lumbar spondylosis and degenerative disc disease. IMPRESSION: 1. Motion artifact substantially degrades images and adversely affects diagnostic accuracy. 2. Striking  degree of intrahepatic biliary dilatation, questionable filling defect(s) at the confluence of the right and left hepatic ducts. Distal to this the common hepatic duct is only borderline dilated in the distal common bile duct demonstrates normal conical tapering course. No definite enhancing hepatic mass is identified. 3. No specific imaging findings of pancreatitis. 4. Third spacing of fluid with trace ascites, mesenteric edema, and subcutaneous edema. 5. Aortic atherosclerosis, potentially with substantial atherosclerotic narrowing of the proximal right common iliac artery. Electronically Signed   By: Gaylyn Rong M.D.   On: 01/05/2020 09:37   MR ABDOMEN MRCP W WO CONTAST  Result Date: 01/05/2020 CLINICAL DATA:  Pancreatitis EXAM: MRI ABDOMEN WITHOUT AND WITH CONTRAST (INCLUDING MRCP) TECHNIQUE: Multiplanar multisequence MR imaging of the abdomen was performed both before and after the administration of intravenous contrast. Heavily T2-weighted images of the biliary and pancreatic ducts were obtained, and three-dimensional MRCP images were rendered by post processing. CONTRAST:  105mL GADAVIST GADOBUTROL 1 MMOL/ML IV SOLN COMPARISON:  None. FINDINGS: Despite efforts by the technologist and patient, motion artifact is present on today's exam and could not be eliminated. This reduces exam sensitivity and specificity. Lower chest: Subsegmental atelectasis in the posterior basal segments of both lower lobes. Hepatobiliary: Prominent intrahepatic biliary dilatation. In the confluence of the right and left hepatic ducts, there is a suggestion of vague filling defects within the biliary tree, for example on image 20/6, without definite enhancement. This could represent sludge, blood products, tumor, stricture/infection, or flow related artifact. This is followed by a mildly dilated segment of the common hepatic duct at 1.1 cm, which tapers distally and in the common bile duct as shown on images 19 through 20 of  series 6 in a conical fashion towards the ampulla. I do not see a distal filling defect in the common bile duct which is of more normal caliber. Aside from the clearly abnormal biliary tree, no specific individual parenchymal mass is identified Pancreas: Normal caliber and course of the dorsal pancreatic duct. No findings of pancreatic pseudocyst or abscess. Subject to limitations by the motion artifact, no obvious pancreatic mass is identified. There is low-grade edema tracking along the upper abdominal mesentery but this is commensurate to the subcutaneous edema and appears more generalized, and accordingly we do not demonstrate specific imaging findings of acute pancreatitis. Spleen:  Unremarkable Adrenals/Urinary Tract: Right extrarenal pelvis without hydronephrosis. Adrenal glands unremarkable. Stomach/Bowel: Borderline prominence of colon caliber, constipation not excluded. No dilated small bowel in the visualized region. Vascular/Lymphatic: There is evidence of abdominal aortic atherosclerosis along with potentially substantial atherosclerotic narrowing of the  proximal right common iliac artery. Other: Diffuse subcutaneous edema with generalized edema along the mesentery and tracking along the paraspinal musculature. Trace perihepatic and perisplenic ascites. Musculoskeletal: Mild dextroconvex scoliosis in the vicinity of the thoracolumbar junction. Lumbar spondylosis and degenerative disc disease. IMPRESSION: 1. Motion artifact substantially degrades images and adversely affects diagnostic accuracy. 2. Striking degree of intrahepatic biliary dilatation, questionable filling defect(s) at the confluence of the right and left hepatic ducts. Distal to this the common hepatic duct is only borderline dilated in the distal common bile duct demonstrates normal conical tapering course. No definite enhancing hepatic mass is identified. 3. No specific imaging findings of pancreatitis. 4. Third spacing of fluid with  trace ascites, mesenteric edema, and subcutaneous edema. 5. Aortic atherosclerosis, potentially with substantial atherosclerotic narrowing of the proximal right common iliac artery. Electronically Signed   By: Gaylyn Rong M.D.   On: 01/05/2020 09:37   CT OUTSIDE FILMS BODY/ABD/PELVIS  Result Date: 01/04/2020 This examination belongs to an outside facility and is stored here for comparison purposes only.  Contact the originating outside institution for any associated report or interpretation.    LOS: 2 days   Lanae Boast, MD [facility]  01/05/2020, 2:21 PM

## 2020-01-05 NOTE — Progress Notes (Signed)
Initial Nutrition Assessment  DOCUMENTATION CODES:   Underweight, Severe malnutrition in context of chronic illness  INTERVENTION:   -Recommend measured weight for admission  Once diet advanced: -Chopped meats with meals -Ensure Enlive po BID, each supplement provides 350 kcal and 20 grams of protein -Magic cup BID with meals, each supplement provides 290 kcal and 9 grams of protein  NUTRITION DIAGNOSIS:   Severe Malnutrition related to chronic illness (COPD) as evidenced by severe fat depletion, severe muscle depletion, energy intake < or equal to 75% for > or equal to 1 month, percent weight loss.  GOAL:   Patient will meet greater than or equal to 90% of their needs  MONITOR:   Diet advancement, Labs, Weight trends, I & O's  REASON FOR ASSESSMENT:   Consult Assessment of nutrition requirement/status  ASSESSMENT:   84 y.o. male with medical history significant of steroid dependent COPD, h/o aspiration and aspiration pneumonia, BPH, protein calorie malnutrition depression who has had rapid weight loss and failure to thrive and sent to the ED at [facility] 01/01/20 where work-up showed CT scan showing intra-/extrahepatic duct dilatation diagnosing acute pancreatitis and transferred to [facility] 01/03/20.  Patient in room with wife at bedside. Pt's wife provided most of history. Pt has recently had very poor appetite which had progressively gotten worse over the past 3-4 months. Pt requires "soft" foods per wife, RD to order chopped meats with meals once diet advanced. Pt has been trying to drink Ensure/Boost drinks at home. Will order supplements as well.   Per wife, pt weighed 111 lbs in [place] at other hospital on 8/12. Weight that was recorded today (105 lbs) is the exact same that was recorded on 12/08/19 in care everywhere. Suspect this is stated/gathered weight and not measured. Recommend measured weight for admission. Pt's wife states pt  weighed ~120 lbs 2-3 months ago. This would be a 7.5% weight loss x 2-3 months (using weight from [facility] hospital). This is significant for time frame.  Labs reviewed. Medications: Senokot, Lactated ringers  NUTRITION - FOCUSED PHYSICAL EXAM:    Most Recent Value  Orbital Region Moderate depletion  Upper Arm Region Severe depletion  Thoracic and Lumbar Region Unable to assess  Buccal Region Moderate depletion  Temple Region Severe depletion  Clavicle Bone Region Severe depletion  Clavicle and Acromion Bone Region Severe depletion  Scapular Bone Region Moderate depletion  Dorsal Hand Mild depletion  Patellar Region Unable to assess  Anterior Thigh Region Unable to assess  Posterior Calf Region Unable to assess  Edema (RD Assessment) None  Hair Reviewed  Eyes Reviewed  Mouth --  [masked]  Skin Reviewed       Diet Order:   Diet Order            Diet NPO time specified  Diet effective now                 EDUCATION NEEDS:   No education needs have been identified at this time  Skin:  Skin Assessment: Reviewed RN Assessment  Last BM:  8/15  Height:   Ht Readings from Last 1 Encounters:  01/05/20 5\' 10"  (1.778 m)    Weight:   Wt Readings from Last 1 Encounters:  01/05/20 47.6 kg    BMI:  Body mass index is 15.07 kg/m.  Estimated Nutritional Needs:   Kcal:  1500-1700  Protein:  75-85g  Fluid:  1.7L/day   01/07/20, MS, RD, LDN Inpatient Clinical Dietitian  Contact information available via Amion

## 2020-01-05 NOTE — Anesthesia Procedure Notes (Signed)
Procedure Name: Intubation Date/Time: 01/05/2020 1:21 PM Performed by: West Pugh, CRNA Pre-anesthesia Checklist: Patient identified, Emergency Drugs available, Suction available, Patient being monitored and Timeout performed Patient Re-evaluated:Patient Re-evaluated prior to induction Oxygen Delivery Method: Circle system utilized Preoxygenation: Pre-oxygenation with 100% oxygen Induction Type: IV induction, Rapid sequence and Cricoid Pressure applied Laryngoscope Size: Mac and 4 Grade View: Grade I Tube type: Oral Tube size: 7.5 mm Number of attempts: 1 Airway Equipment and Method: Stylet Placement Confirmation: ETT inserted through vocal cords under direct vision,  positive ETCO2,  CO2 detector and breath sounds checked- equal and bilateral Secured at: 22 cm Tube secured with: Tape Dental Injury: Teeth and Oropharynx as per pre-operative assessment

## 2020-01-05 NOTE — Anesthesia Preprocedure Evaluation (Addendum)
Anesthesia Evaluation  Patient identified by MRN, date of birth, ID band  Reviewed: Allergy & Precautions, Patient's Chart, lab work & pertinent test results  Airway Mallampati: II  TM Distance: >3 FB Neck ROM: Limited    Dental  (+) Lower Dentures, Upper Dentures   Pulmonary COPD,  COPD inhaler, former smoker,  Chronic aspiration   + rhonchi        Cardiovascular negative cardio ROS Normal cardiovascular exam Rhythm:Regular Rate:Normal     Neuro/Psych PSYCHIATRIC DISORDERS Depression negative neurological ROS     GI/Hepatic Neg liver ROS, Acute pancreatitis Elevated LFTs, dilated CBD concern for choledocholithiasis  Chronic aspiration   Endo/Other  negative endocrine ROS  Renal/GU negative Renal ROS   BPH    Musculoskeletal negative musculoskeletal ROS (+)   Abdominal Normal abdominal exam  (+)   Peds  Hematology  (+) Blood dyscrasia, anemia , hct 35.1   Anesthesia Other Findings   Reproductive/Obstetrics negative OB ROS                          Anesthesia Physical Anesthesia Plan  ASA: IV  Anesthesia Plan: General   Post-op Pain Management:    Induction: Intravenous, Rapid sequence and Cricoid pressure planned  PONV Risk Score and Plan: 2 and Treatment may vary due to age or medical condition, Ondansetron and Dexamethasone  Airway Management Planned: Oral ETT  Additional Equipment: None  Intra-op Plan:   Post-operative Plan: Extubation in OR  Informed Consent: I have reviewed the patients History and Physical, chart, labs and discussed the procedure including the risks, benefits and alternatives for the proposed anesthesia with the patient or authorized representative who has indicated his/her understanding and acceptance.   Patient has DNR.  Discussed DNR with patient and Continue DNR.   Dental advisory given  Plan Discussed with:   Anesthesia Plan Comments: (Will  plan to intubate for the procedure and understands that the ETT might need to stay in depending on his respiratory status at the end of the procedure. Patient does not want any chest compressions or defibrillation under any circumstance. He is ok with medication management that would treat any Heart rate or BP issues. Tanna Furry, MD 1245pm 01/05/20)      Anesthesia Quick Evaluation

## 2020-01-05 NOTE — Transfer of Care (Signed)
Immediate Anesthesia Transfer of Care Note  Patient: Shawn Davis  Procedure(s) Performed: ENDOSCOPIC RETROGRADE CHOLANGIOPANCREATOGRAPHY (ERCP) (N/A ) ESOPHAGOGASTRODUODENOSCOPY (EGD) (N/A ) BIOPSY SPHINCTEROTOMY BILIARY BRUSHING PANCREATIC STENT PLACEMENT BILIARY STENT PLACEMENT (N/A ) BILIARY DILATION REMOVAL OF STONES  Patient Location: Endoscopy Unit  Anesthesia Type:MAC  Level of Consciousness: drowsy and responds to stimulation  Airway & Oxygen Therapy: Patient Spontanous Breathing and Patient connected to face mask oxygen  Post-op Assessment: Report given to RN, Post -op Vital signs reviewed and stable and Patient moving all extremities  Post vital signs: Reviewed and stable  Last Vitals:  Vitals Value Taken Time  BP    Temp    Pulse    Resp    SpO2      Last Pain:  Vitals:   01/05/20 1236  TempSrc: Oral  PainSc: 0-No pain         Complications: No complications documented.

## 2020-01-05 NOTE — Progress Notes (Signed)
Patient ID: Shawn Davis, male   DOB: Dec 20, 1935, 84 y.o.   MRN: [id number]    Progress Note   Subjective   day # 2  CC;weight loss, dilated ducts , elevated LFTs, severe COPD steroid and O2 dependent  MRI/MRCP-completed this a.m., spoke with radiologist reading currently  WBC 5.0, hemoglobin 11.5 T bili 2.4/alk phos 223/ALT 255/AST 121 trending down  IV Zosyn  Wife at bedside, patient denies any current abdominal pain.  Says he is a little bit more short of breath than usual.  On 2 L  MRI/MRCP-motion artifact Striking intrahepatic biliary dilation, questionable filling defects at the confluence of the right and left hepatic ducts, distal to this the common hepatic duct only borderline dilated and distal CBD shows normal conical tapering course no definite enhancing hepatic mass, no pancreatitis, trace ascites    Objective   Vital signs in last 24 hours: Temp:  [97.4 F (36.3 C)-97.7 F (36.5 C)] 97.6 F (36.4 C) (08/16 0636) Pulse Rate:  [77-82] 82 (08/16 0636) Resp:  [14-20] 18 (08/16 0636) BP: (129-147)/(69-94) 147/94 (08/16 0636) SpO2:  [97 %-100 %] 100 % (08/16 0636) Last BM Date: 01/04/20 General: Elderly male in NAD thin, frail-appearing Heart:  Regular rate and rhythm; no murmurs Lungs: Respirations even and unlabored, decreased breath sounds, few expiratory wheezes Abdomen:  Soft, nontender and nondistended. Normal bowel sounds. Extremities:  Without edema. Neurologic:  Alert and oriented,  grossly normal neurologically. Psych:  Cooperative. Normal mood and affect.  Intake/Output from previous day: 08/15 0701 - 08/16 0700 In: 204.1 [I.V.:61.2; IV Piggyback:142.9] Out: 300 [Urine:300] Intake/Output this shift: No intake/output data recorded.  Lab Results: Recent Labs    01/03/20 2256 01/04/20 0815 01/05/20 0501  WBC 9.6 8.8 5.0  HGB 14.0 13.2 11.5*  HCT 44.7 41.5 35.1*  PLT 223 222 170   BMET Recent Labs    01/03/20 2256 01/04/20 0815  01/05/20 0501  NA 137 137 136  K 5.0 4.6 3.7  CL 99 97* 97*  CO2 '25 28 30  ' GLUCOSE 94 131* 124*  BUN 28* 29* 22  CREATININE 0.83 0.83 0.63  CALCIUM 9.4 9.0 8.9   LFT Recent Labs    01/05/20 0501  PROT 5.3*  ALBUMIN 3.2*  AST 121*  ALT 255*  ALKPHOS 223*  BILITOT 2.3*   PT/INR No results for input(s): LABPROT, INR in the last 72 hours.  Studies/Results: CT OUTSIDE FILMS BODY/ABD/PELVIS  Result Date: 01/04/2020 This examination belongs to an outside facility and is stored here for comparison purposes only.  Contact the originating outside institution for any associated report or interpretation.      Assessment / Plan:    #80 84 year old white male admitted yesterday from outside hospital with abnormal LFTs, and biliary ductal dilation noted on outside CT scan done at [facility].  Gallbladder in situ with gallstones noted  Patient has had recent weight loss, also with underlying severe COPD O2 and steroid-dependent  MRI/MRCP this a.m. confirms significant intrahepatic ductal dilation and probable filling defects at the confluence, distal CBD tapers normally.  No pancreatic or hepatic mass noted  Plan; Dr. Rush Landmark  to proceed with ERCP this afternoon. Findings of MRCP discussed with the patient and wife and reviewed ERCP again in detail.         Active Problems:   Pancreatitis, acute   Steroid-dependent COPD ([place])   BPH (benign prostatic hyperplasia)   Chronic pulmonary aspiration     LOS: 2 days   Acire Tang  EsterwoodPA-C  01/05/2020, 9:12 AM

## 2020-01-05 NOTE — Interval H&P Note (Signed)
History and Physical Interval Note:  01/05/2020 1:07 PM  Shawn Davis  has presented today for surgery, with the diagnosis of Abnormal LFTs & Dilated Bile Duct concern Choledocholithiasis.  The various methods of treatment have been discussed with the patient and family. After consideration of risks, benefits and other options for treatment, the patient has consented to  Procedure(s): ENDOSCOPIC RETROGRADE CHOLANGIOPANCREATOGRAPHY (ERCP) (N/A) as a surgical intervention.  The patient's history has been reviewed, patient examined, no change in status, stable for surgery.  I have reviewed the patient's chart and labs.  Questions were answered to the patient's satisfaction.     [employer]

## 2020-01-05 NOTE — Op Note (Signed)
[facility] Patient Name: Shawn Davis Procedure Date: 01/05/2020 MRN: [id number] Attending MD: Justice Britain , MD Date of Birth: 24-Feb-1936 CSN: [id number] Age: 84 Admit Type: Inpatient Procedure:                ERCP Indications:              Bile duct stone(s), Abnormal abdominal CT, Abnormal                            MRCP, Jaundice, Abnormal liver function test, Acute                            pancreatitis, Gallstone associated acute                            pancreatitis Providers:                Justice Britain, MD, Baird Cancer, RN, Cherylynn Ridges, Technician, Christell Faith, CRNA Referring MD:             [facility] Medicines:                General Anesthesia, Zosyn 3.375 g IV, Indomethacin                            100 mg PR, Glucagon 1 mg IV Complications:            No immediate complications. Estimated Blood Loss:     Estimated blood loss was minimal. Procedure:                Pre-Anesthesia Assessment:                           - Prior to the procedure, a History and Physical                            was performed, and patient medications and                            allergies were reviewed. The patient's tolerance of                            previous anesthesia was also reviewed. The risks                            and benefits of the procedure and the sedation                            options and risks were discussed with the patient.                            All questions were answered, and informed consent  was obtained. Prior Anticoagulants: The patient has                            taken no previous anticoagulant or antiplatelet                            agents. ASA Grade Assessment: III - A patient with                            severe systemic disease. After reviewing the risks                            and benefits, the patient was deemed in                             satisfactory condition to undergo the procedure.                           After obtaining informed consent, the scope was                            passed under direct vision. Throughout the                            procedure, the patient's blood pressure, pulse, and                            oxygen saturations were monitored continuously. The                            [id number]-Q180V ([id number]) Olympus Duodenoscope was                            introduced through the mouth, and used to inject                            contrast into and used to inject contrast into the                            bile duct and ventral pancreatic duct. The GIF-H190                            ([id number]) Olympus gastroscope was introduced                            through the mouth, and used to inject contrast into                            and used to locate the major papilla. The ERCP was                            technically difficult and complex due to  challenging cannulation. Successful completion of                            the procedure was aided by performing the maneuvers                            documented (below) in this report. The patient                            tolerated the procedure. Scope In: Scope Out: Findings:      The scout film was normal.      A standard esophagogastroduodenoscopy scope was used for the examination       of the upper gastrointestinal tract. The scope was passed under direct       vision through the upper GI tract. No gross lesions were noted in the       entire esophagus. Segmental moderate inflammation with hemorrhage       characterized by congestion (edema), erythema, friability and       granularity was found in the gastric body, at the incisura and in the       gastric antrum. Biopsies were taken in the cardia, on the greater       curvature of the stomach, on the lesser curvature of the stomach, at the       incisura and  in the gastric antrum through the       esophagogastroduodenoscope with the cold forceps for histology and for       H. pylori rule out. No gross lesions were noted in the duodenal bulb, in       the first portion of the duodenum and in the second portion of the       duodenum. The major papilla was congested and small and found at an       acute angle.      The bile duct could not be cannulated with the short-nosed traction       sphincterotome on initial attempt in short position and long-position.       Repeated attempts at biliary cannulation were not successful while using       a wire-guided approach. Eventually this led to placement of the wire       within the presumed pancreatic duct. Decision was made to pursue a       double-wire approach. The wire was left within the pancreatic duct. The       ventral pancreatic duct was deeply cannulated with the short-nosed       traction sphincterotome. Contrast was injected. I personally interpreted       the pancreatic duct images. Ductal flow of contrast was adequate. Image       quality was adequate. Contrast extended to the pancreatic duct.       Opacification of the ventral pancreatic duct in the head of the       pancreas, pancreatic duct in the genu of the pancreas and pancreatic       duct in the body of the pancreas was successful. The maximum diameter of       the ducts was 3 mm. The entire opacified area was normal. I had injected       in effort of trying to better define the distal anatomy and see if the  CBD could be visualized, but this did not help.      I changed to a short 0.025 inch Revolution Jagwire and after superficial       cannulation and extreme bowing of the sphincterotome, the wire passed       into the biliary tree. The Revolution Jagtome sphincterotome was passed       over the guidewire and the bile duct was then deeply cannulated.       Contrast was injected. I personally interpreted the bile duct images.        Ductal flow of contrast was adequate. Image quality was adequate.       Contrast extended to the hepatic ducts. Opacification of the entire       biliary tree except for the gallbladder was successful. The lower third       of the main duct contained a single moderate narrowing/stenosis 15 mm in       length. The middle third of the main bile duct contained filling defects       thought to be sludge more than stones. The middle third of the main bile       duct, upper third of the main bile duct, left main hepatic duct and       right main hepatic duct were moderately dilated. The largest diameter       was 14 mm. An 8 mm biliary sphincterotomy was made with a monofilament       Jagtome sphincterotome using ERBE electrocautery. There was self limited       oozing from the sphincterotomy which did not require treatment. One 4 Fr       by 5 cm temporary plastic pancreatic stent with a full external pigtail       was placed into the ventral pancreatic duct. The stent was in good       position. To discover objects, the biliary tree was swept with a       retrieval balloon starting at the bifurcation. Sludge was swept from the       duct. To optimize the drainage, I proceed with a dilation of the distal       common bile duct with a Hurricane 6 mm balloon dilator was successful.       Cells for cytology were obtained by brushing the stenosis. To discover       objects, the biliary tree was swept with a retrieval balloon starting at       the bifurcation. Sludge was swept from the duct. An occlusion       cholangiogram was performed that showed no further significant biliary       pathology. Drainage was poor. Due to the narrowing/stenosis, I had       concern whether this was inflammatory or something more, so I proceeded       with placement of one 10 Fr by 5 cm plastic biliary stent with a single       external flap and a single internal flap into the common bile duct. Bile       flowed  through the stent. The stent was in good position.      The duodenoscope was withdrawn from the patient. Impression:               - No gross lesions in esophagus.                           -  Gastritis with hemorrhage. Biopsied.                           - No gross lesions in the duodenal bulb, in the                            first portion of the duodenum and in the second                            portion of the duodenum.                           - The major papilla appeared congested and small                            and found at an acute angle.                           - Biliary cannulation was difficult requiring                            double-wire technique as well as injection of                            pancreatic duct in order to try and define anatomy.                           - The fluroscopic examination was suspicious for                            sludge.                           - A single moderate biliary stenosis was found in                            the lower third of the main bile duct. This was                            indeterminate. Dilated. Brushed for cytology.                           - The upper third of the main bile duct, middle                            third of the main bile duct, left main hepatic duct                            and right main hepatic duct were moderately dilated.                           - A biliary sphincterotomy was performed.                           -  Significant biliary sludge was extracted from the                            patient's biliary tree.                           - One temporary plastic pancreatic stent was placed                            into the ventral pancreatic duct to decrease PEP.                           - One plastic biliary stent was placed into the                            common bile duct. Moderate Sedation:      Not Applicable - Patient had care per Anesthesia. Recommendation:            - The patient will be observed post-procedure,                            until all discharge criteria are met.                           - Return patient to hospital ward for ongoing care.                           - Advance diet as tolerated.                           - Check liver enzymes (AST, ALT, alkaline                            phosphatase, bilirubin) in the morning.                           - Observe patient's clinical course.                           - Watch for pancreatitis, bleeding, perforation,                            and cholangitis.                           - Would hold chemical VTE prophylaxis for 48 hours                            to decrease risk of post-sphincterotomy bleeding.                            If anticoagulation is necessary consider heparin                            gtt without bolus in 6-hours and monitor closely.                           -  Await cytology results and await path results.                           - Start Protonix 40 mg daily.                           - If no evidence of malignancy on biliary stenosis                            biopsies, it is worthwhile to consider surgical                            resection of the gallbladder as the etiology for                            his pancreatitis. I am not convinced that this is a                            malignant process occuring, but certainly, patient                            is at risk based on his current status. If LFTs do                            not continue to improve, since gallbladder did not                            fill completely, consider HIDA scan to ensure no                            evidence of concomitant cholecystitis is present.                           - At this time, I see no need for continuing                            Antibiotics further than tomorrow.                           - Patient will need a KUB 2-view in 10-14 days to                             ensure pancreatic stent has migrated successfully.                            If still present at that time will need to be                            scheduled for EGD with stent pull.                           - The findings and recommendations were discussed  with the patient.                           - The findings and recommendations were discussed                            with the patient's family.                           - The findings and recommendations were discussed                            with the referring physician. Procedure Code(s):        --- Professional ---                           [id number], Endoscopic retrograde                            cholangiopancreatography (ERCP); with placement of                            endoscopic stent into biliary or pancreatic duct,                            including pre- and post-dilation and guide wire                            passage, when performed, including sphincterotomy,                            when performed, each stent                           [id number], 47, Endoscopic retrograde                            cholangiopancreatography (ERCP); with placement of                            endoscopic stent into biliary or pancreatic duct,                            including pre- and post-dilation and guide wire                            passage, when performed, including sphincterotomy,                            when performed, each stent                           [id number], Endoscopic retrograde                            cholangiopancreatography (ERCP); with removal of  calculi/debris from biliary/pancreatic duct(s)                           [phone], Esophagogastroduodenoscopy, flexible,                            transoral; with biopsy, single or multiple Diagnosis Code(s):        --- Professional ---                           K80.51, Calculus of bile duct without  cholangitis                            or cholecystitis with obstruction                           K29.71, Gastritis, unspecified, with bleeding                           K83.8, Other specified diseases of biliary tract                           R17, Unspecified jaundice                           R94.5, Abnormal results of liver function studies                           K85.90, Acute pancreatitis without necrosis or                            infection, unspecified                           K85.10, Biliary acute pancreatitis without necrosis                            or infection                           R93.5, Abnormal findings on diagnostic imaging of                            other abdominal regions, including retroperitoneum                           R93.2, Abnormal findings on diagnostic imaging of                            liver and biliary tract CPT copyright 2019 [employer]. All rights reserved. The codes documented in this report are preliminary and upon coder review may  be revised to meet current compliance requirements. Justice Britain, MD 01/05/2020 3:56:49 PM Number of Addenda: 0

## 2020-01-06 DIAGNOSIS — R945 Abnormal results of liver function studies: Secondary | ICD-10-CM | POA: Diagnosis not present

## 2020-01-06 DIAGNOSIS — Z8719 Personal history of other diseases of the digestive system: Secondary | ICD-10-CM

## 2020-01-06 DIAGNOSIS — Z9889 Other specified postprocedural states: Secondary | ICD-10-CM | POA: Diagnosis not present

## 2020-01-06 DIAGNOSIS — K831 Obstruction of bile duct: Secondary | ICD-10-CM

## 2020-01-06 DIAGNOSIS — T17908S Unspecified foreign body in respiratory tract, part unspecified causing other injury, sequela: Secondary | ICD-10-CM

## 2020-01-06 LAB — CBC
HCT: 35.9 % — ABNORMAL LOW (ref 39.0–52.0)
Hemoglobin: 11.7 g/dL — ABNORMAL LOW (ref 13.0–17.0)
MCH: 32.2 pg (ref 26.0–34.0)
MCHC: 32.6 g/dL (ref 30.0–36.0)
MCV: 98.9 fL (ref 80.0–100.0)
Platelets: 178 10*3/uL (ref 150–400)
RBC: 3.63 MIL/uL — ABNORMAL LOW (ref 4.22–5.81)
RDW: 14.2 % (ref 11.5–15.5)
WBC: 5.1 10*3/uL (ref 4.0–10.5)
nRBC: 0 % (ref 0.0–0.2)

## 2020-01-06 LAB — COMPREHENSIVE METABOLIC PANEL
ALT: 241 U/L — ABNORMAL HIGH (ref 0–44)
AST: 100 U/L — ABNORMAL HIGH (ref 15–41)
Albumin: 3.3 g/dL — ABNORMAL LOW (ref 3.5–5.0)
Alkaline Phosphatase: 214 U/L — ABNORMAL HIGH (ref 38–126)
Anion gap: 7 (ref 5–15)
BUN: 17 mg/dL (ref 8–23)
CO2: 30 mmol/L (ref 22–32)
Calcium: 8.7 mg/dL — ABNORMAL LOW (ref 8.9–10.3)
Chloride: 98 mmol/L (ref 98–111)
Creatinine, Ser: 0.61 mg/dL (ref 0.61–1.24)
GFR calc Af Amer: >60 mL/min (ref >60)
GFR calc non Af Amer: >60 mL/min (ref >60)
Glucose, Bld: 92 mg/dL (ref 70–99)
Potassium: 4 mmol/L (ref 3.5–5.1)
Sodium: 135 mmol/L (ref 135–145)
Total Bilirubin: 1.5 mg/dL — ABNORMAL HIGH (ref 0.3–1.2)
Total Protein: 5.6 g/dL — ABNORMAL LOW (ref 6.5–8.1)

## 2020-01-06 MED ORDER — ENSURE ENLIVE PO LIQD
237.0000 mL | Freq: Three times a day (TID) | ORAL | Status: DC
  Administered 2020-01-06 – 2020-01-11 (×12): 237 mL via ORAL

## 2020-01-06 MED FILL — Piperacillin Sod-Tazobactam Sod in Dex IV Sol 3-0.375GM/50ML: INTRAVENOUS | Qty: 50 | Status: AC

## 2020-01-06 NOTE — Progress Notes (Signed)
PROGRESS NOTE    Shawn GRAHAM  Davis:[id number] DOB: 1935-12-13 DOA: 01/03/2020 PCP: Marlise Eves, MD   Chief complaint: FTT  Brief Narrative: 84 y.o. male with medical history significant of steroid dependent COPD, h/o aspiration and aspiration pneumonia, BPH, protein calorie malnutrition depression who has had rapid weight loss and failure to thrive and sent to the ED at [facility] 01/01/20 where work-up showed CT scan showing intra-/extrahepatic duct dilatation diagnosing acute pancreatitis and transferred to [facility] long hospital 01/03/20.   Subjective: Resting comfortably feels weak.  Daughter at the bedside.  Status post ERCP sludge removal and plastic stent placement 8/16 LFTs downtrending. Assessment & Plan:  Abnormal LFTs with dilated biliary tree secondary to biliary stricture: At outside facility diagnosed as pancreatitis:MRCP  with intrahepatic biliary dilatation, questionable filling defects at the confluence of the right and left hepatic ducts, third spacing of fluid with trace ascites, mesenteric edema and subcutaneous edema.  Status post ERCP, biliary stricture brushed, sludge extracted and plastic stent placed in the pancreatic duct.  LFTs downtrending.  Continue to monitor, CMP in the morning, KUB in 10 to 14 days to assess pancreatic duct stent migration if retained will need repeat EGD to pull it out.  Antibiotics post procedure per gi.  Failure to thrive/rapid weight loss:poor oral intake for few months, with chronic medical condition.  Continue to augment nutrition PT OT.  Followed by dietitian and speech at home  COPD/Chronic hypoxic respiratory failure on 2-3 l [place].per daughter "He has trouble breathing all the time" and has had recent hospitalization with subsequent decline failure to thrive: Continue inhaler.  BPH: Stable on Flomax and finasteride  Chronic pulmonary aspiration history followed by speech and nutrition at home.  Underweight severe  protein calorie malnutrition- augment diet  DVT prophylaxis:SCD and consider prophylaxis if no procedure Code Status:   Code Status: DNR  Family Communication: plan of care discussed with patient and his daughter at the bedside.  Does not feel ready for discharge today.  Status is: Inpatient Remains inpatient appropriate because:Ongoing diagnostic testing needed not appropriate for outpatient work up and IV treatments appropriate due to intensity of illness or inability to take PO Dispo:The patient is from: Home            Anticipated d/c is to: Home            Anticipated d/c date is: 1 day onset is appears stable and okay with GI, pending cytology in the morning            Patient currently is not medically stable to d/c.   Nutrition: Diet Order            Diet full liquid Room service appropriate? Yes; Fluid consistency: Thin  Diet effective now                 Body mass index is 15.07 kg/m.  Consultants:see note  Procedures:see note Microbiology:see note Blood Culture No results found for: SDES, SPECREQUEST, CULT, REPTSTATUS  Other culture-see note  Medications: Scheduled Meds: . arformoterol  15 mcg Nebulization BID  . finasteride  5 mg Oral Daily  . FLUoxetine  40 mg Oral Daily  . senna  1 tablet Oral BID  . tamsulosin  0.4 mg Oral Daily   Continuous Infusions: . piperacillin-tazobactam (ZOSYN)  IV 3.375 g (01/06/20 4401)    Antimicrobials: Anti-infectives (From admission, onward)   Start     Dose/Rate Route Frequency Ordered Stop   01/04/20  0000  piperacillin-tazobactam (ZOSYN) IVPB 3.375 g  Status:  Discontinued        3.375 g 100 mL/hr over 30 Minutes Intravenous Every 6 hours 01/03/20 2230 01/03/20 2235   01/03/20 2245  piperacillin-tazobactam (ZOSYN) IVPB 3.375 g     Discontinue     3.375 g 12.5 mL/hr over 240 Minutes Intravenous Every 8 hours 01/03/20 2235         Objective: Vitals: Today's Vitals   01/05/20 2057 01/05/20 2129 01/06/20 0531  01/06/20 0943  BP:   139/82   Pulse:   82   Resp:   18   Temp:   97.7 F (36.5 C)   TempSrc:   Oral   SpO2:  96% 99%   Weight:      Height:      PainSc: 0-No pain   0-No pain    Intake/Output Summary (Last 24 hours) at 01/06/2020 1251 Last data filed at 01/06/2020 1000 Gross per 24 hour  Intake 1000.78 ml  Output 1100 ml  Net -99.22 ml   Filed Weights   01/05/20 1236  Weight: 47.6 kg   Weight change:    Intake/Output from previous day: 08/16 0701 - 08/17 0700 In: 892.4 [P.O.:240; I.V.:468.7; IV Piggyback:183.7] Out: 1200 [Urine:1200] Intake/Output this shift: Total I/O In: 140 [P.O.:100; IV Piggyback:40] Out: 100 [Urine:100]  Examination:  General exam: AAO , NAD, weak appearing.  Old for age, frail looking. HEENT:Oral mucosa moist, Ear/Nose WNL grossly, dentition normal. Respiratory system: bilaterally clear,no wheezing or crackles,no use of accessory muscle Cardiovascular system: S1 & S2 +, No JVD,. Gastrointestinal system: Abdomen soft, NT,ND, BS+ Nervous System:Alert, awake, moving extremities and grossly nonfocal Extremities: No edema, distal peripheral pulses palpable.  Skin: No rashes,no icterus. MSK: Normal muscle bulk,tone, power  Data Reviewed: I have personally reviewed following labs and imaging studies CBC: Recent Labs  Lab 01/03/20 2256 01/04/20 0815 01/05/20 0501 01/06/20 0512  WBC 9.6 8.8 5.0 5.1  NEUTROABS 8.8*  --   --   --   HGB 14.0 13.2 11.5* 11.7*  HCT 44.7 41.5 35.1* 35.9*  MCV 100.7* 101.2* 98.3 98.9  PLT 223 222 170 178   Basic Metabolic Panel: Recent Labs  Lab 01/03/20 2256 01/04/20 0815 01/05/20 0501 01/06/20 0512  NA 137 137 136 135  K 5.0 4.6 3.7 4.0  CL 99 97* 97* 98  CO2 25 28 30 30   GLUCOSE 94 131* 124* 92  BUN 28* 29* 22 17  CREATININE 0.83 0.83 0.63 0.61  CALCIUM 9.4 9.0 8.9 8.7*  MG  --  2.0  --   --    GFR: Estimated Creatinine Clearance: 47.9 mL/min (by C-G formula based on SCr of 0.61 mg/dL). Liver  Function Tests: Recent Labs  Lab 01/03/20 2256 01/04/20 0815 01/05/20 0501 01/06/20 0512  AST 143* 132* 121* 100*  ALT 311* 293* 255* 241*  ALKPHOS 307* 273* 223* 214*  BILITOT 3.4* 2.4* 2.3* 1.5*  PROT 6.4* 5.9* 5.3* 5.6*  ALBUMIN 3.7 3.5 3.2* 3.3*   Recent Labs  Lab 01/03/20 2256 01/04/20 0815  LIPASE 18 22  AMYLASE 48  --    No results for input(s): AMMONIA in the last 168 hours. Coagulation Profile: No results for input(s): INR, PROTIME in the last 168 hours. Cardiac Enzymes: No results for input(s): CKTOTAL, CKMB, CKMBINDEX, TROPONINI in the last 168 hours. BNP (last 3 results) No results for input(s): PROBNP in the last 8760 hours. HbA1C: No results for input(s): HGBA1C in the last  72 hours. CBG: No results for input(s): GLUCAP in the last 168 hours. Lipid Profile: No results for input(s): CHOL, HDL, LDLCALC, TRIG, CHOLHDL, LDLDIRECT in the last 72 hours. Thyroid Function Tests: No results for input(s): TSH, T4TOTAL, FREET4, T3FREE, THYROIDAB in the last 72 hours. Anemia Panel: No results for input(s): VITAMINB12, FOLATE, FERRITIN, TIBC, IRON, RETICCTPCT in the last 72 hours. Sepsis Labs: No results for input(s): PROCALCITON, LATICACIDVEN in the last 168 hours.  Recent Results (from the past 240 hour(s))  SARS Coronavirus 2 by RT PCR (hospital order, performed in [facility] hospital lab) Nasopharyngeal Nasopharyngeal Swab     Status: None   Collection Time: 01/04/20 11:05 AM   Specimen: Nasopharyngeal Swab  Result Value Ref Range Status   SARS Coronavirus 2 NEGATIVE NEGATIVE Final    Comment: (NOTE) SARS-CoV-2 target nucleic acids are NOT DETECTED.  The SARS-CoV-2 RNA is generally detectable in upper and lower respiratory specimens during the acute phase of infection. The lowest concentration of SARS-CoV-2 viral copies this assay can detect is 250 copies / mL. A negative result does not preclude SARS-CoV-2 infection and should not be used as the sole basis  for treatment or other patient management decisions.  A negative result may occur with improper specimen collection / handling, submission of specimen other than nasopharyngeal swab, presence of viral mutation(s) within the areas targeted by this assay, and inadequate number of viral copies (<250 copies / mL). A negative result must be combined with clinical observations, patient history, and epidemiological information.  Fact Sheet for Patients:   BoilerBrush.com.cy  Fact Sheet for Healthcare Providers: https://pope.com/  This test is not yet approved or  cleared by the [place] FDA and has been authorized for detection and/or diagnosis of SARS-CoV-2 by FDA under an Emergency Use Authorization (EUA).  This EUA will remain in effect (meaning this test can be used) for the duration of the COVID-19 declaration under Section 564(b)(1) of the Act, 21 U.S.C. section 360bbb-3(b)(1), unless the authorization is terminated or revoked sooner.  Performed at [facility], [address], [place], [place] [address]       Radiology Studies: MR 3D Recon At Scanner  Result Date: 01/05/2020 CLINICAL DATA:  Pancreatitis EXAM: MRI ABDOMEN WITHOUT AND WITH CONTRAST (INCLUDING MRCP) TECHNIQUE: Multiplanar multisequence MR imaging of the abdomen was performed both before and after the administration of intravenous contrast. Heavily T2-weighted images of the biliary and pancreatic ducts were obtained, and three-dimensional MRCP images were rendered by post processing. CONTRAST:  75mL GADAVIST GADOBUTROL 1 MMOL/ML IV SOLN COMPARISON:  None. FINDINGS: Despite efforts by the technologist and patient, motion artifact is present on today's exam and could not be eliminated. This reduces exam sensitivity and specificity. Lower chest: Subsegmental atelectasis in the posterior basal segments of both lower lobes. Hepatobiliary: Prominent  intrahepatic biliary dilatation. In the confluence of the right and left hepatic ducts, there is a suggestion of vague filling defects within the biliary tree, for example on image 20/6, without definite enhancement. This could represent sludge, blood products, tumor, stricture/infection, or flow related artifact. This is followed by a mildly dilated segment of the common hepatic duct at 1.1 cm, which tapers distally and in the common bile duct as shown on images 19 through 20 of series 6 in a conical fashion towards the ampulla. I do not see a distal filling defect in the common bile duct which is of more normal caliber. Aside from the clearly abnormal biliary tree, no specific individual  parenchymal mass is identified Pancreas: Normal caliber and course of the dorsal pancreatic duct. No findings of pancreatic pseudocyst or abscess. Subject to limitations by the motion artifact, no obvious pancreatic mass is identified. There is low-grade edema tracking along the upper abdominal mesentery but this is commensurate to the subcutaneous edema and appears more generalized, and accordingly we do not demonstrate specific imaging findings of acute pancreatitis. Spleen:  Unremarkable Adrenals/Urinary Tract: Right extrarenal pelvis without hydronephrosis. Adrenal glands unremarkable. Stomach/Bowel: Borderline prominence of colon caliber, constipation not excluded. No dilated small bowel in the visualized region. Vascular/Lymphatic: There is evidence of abdominal aortic atherosclerosis along with potentially substantial atherosclerotic narrowing of the proximal right common iliac artery. Other: Diffuse subcutaneous edema with generalized edema along the mesentery and tracking along the paraspinal musculature. Trace perihepatic and perisplenic ascites. Musculoskeletal: Mild dextroconvex scoliosis in the vicinity of the thoracolumbar junction. Lumbar spondylosis and degenerative disc disease. IMPRESSION: 1. Motion artifact  substantially degrades images and adversely affects diagnostic accuracy. 2. Striking degree of intrahepatic biliary dilatation, questionable filling defect(s) at the confluence of the right and left hepatic ducts. Distal to this the common hepatic duct is only borderline dilated in the distal common bile duct demonstrates normal conical tapering course. No definite enhancing hepatic mass is identified. 3. No specific imaging findings of pancreatitis. 4. Third spacing of fluid with trace ascites, mesenteric edema, and subcutaneous edema. 5. Aortic atherosclerosis, potentially with substantial atherosclerotic narrowing of the proximal right common iliac artery. Electronically Signed   By: Gaylyn Rong M.D.   On: 01/05/2020 09:37   DG ERCP BILIARY & PANCREATIC DUCTS  Result Date: 01/05/2020 CLINICAL DATA:  Pancreatitis, intrahepatic biliary ductal dilatation with concern of lesion at the biliary confluence on MRCP. EXAM: ERCP TECHNIQUE: Multiple spot images obtained with the fluoroscopic device and submitted for interpretation post-procedure. COMPARISON:  MRCP from earlier the same day FINDINGS: A series of fluoroscopic spot images document endoscopic cannulation and partial opacification of the pancreatic duct and CBD. Limited opacification of the intrahepatic biliary tree. Balloon catheter passage through the CBD. Plastic stent deployment in the pancreatic duct and CBD. IMPRESSION: 1. Endoscopic intervention as above, with pancreatic and biliary stent placement. These images were submitted for radiologic interpretation only. Please see the procedural report for the amount of contrast and the fluoroscopy time utilized. Electronically Signed   By: Corlis Leak M.D.   On: 01/05/2020 16:31   MR ABDOMEN MRCP W WO CONTAST  Result Date: 01/05/2020 CLINICAL DATA:  Pancreatitis EXAM: MRI ABDOMEN WITHOUT AND WITH CONTRAST (INCLUDING MRCP) TECHNIQUE: Multiplanar multisequence MR imaging of the abdomen was performed  both before and after the administration of intravenous contrast. Heavily T2-weighted images of the biliary and pancreatic ducts were obtained, and three-dimensional MRCP images were rendered by post processing. CONTRAST:  83mL GADAVIST GADOBUTROL 1 MMOL/ML IV SOLN COMPARISON:  None. FINDINGS: Despite efforts by the technologist and patient, motion artifact is present on today's exam and could not be eliminated. This reduces exam sensitivity and specificity. Lower chest: Subsegmental atelectasis in the posterior basal segments of both lower lobes. Hepatobiliary: Prominent intrahepatic biliary dilatation. In the confluence of the right and left hepatic ducts, there is a suggestion of vague filling defects within the biliary tree, for example on image 20/6, without definite enhancement. This could represent sludge, blood products, tumor, stricture/infection, or flow related artifact. This is followed by a mildly dilated segment of the common hepatic duct at 1.1 cm, which tapers distally and in the common bile duct  as shown on images 19 through 20 of series 6 in a conical fashion towards the ampulla. I do not see a distal filling defect in the common bile duct which is of more normal caliber. Aside from the clearly abnormal biliary tree, no specific individual parenchymal mass is identified Pancreas: Normal caliber and course of the dorsal pancreatic duct. No findings of pancreatic pseudocyst or abscess. Subject to limitations by the motion artifact, no obvious pancreatic mass is identified. There is low-grade edema tracking along the upper abdominal mesentery but this is commensurate to the subcutaneous edema and appears more generalized, and accordingly we do not demonstrate specific imaging findings of acute pancreatitis. Spleen:  Unremarkable Adrenals/Urinary Tract: Right extrarenal pelvis without hydronephrosis. Adrenal glands unremarkable. Stomach/Bowel: Borderline prominence of colon caliber, constipation not  excluded. No dilated small bowel in the visualized region. Vascular/Lymphatic: There is evidence of abdominal aortic atherosclerosis along with potentially substantial atherosclerotic narrowing of the proximal right common iliac artery. Other: Diffuse subcutaneous edema with generalized edema along the mesentery and tracking along the paraspinal musculature. Trace perihepatic and perisplenic ascites. Musculoskeletal: Mild dextroconvex scoliosis in the vicinity of the thoracolumbar junction. Lumbar spondylosis and degenerative disc disease. IMPRESSION: 1. Motion artifact substantially degrades images and adversely affects diagnostic accuracy. 2. Striking degree of intrahepatic biliary dilatation, questionable filling defect(s) at the confluence of the right and left hepatic ducts. Distal to this the common hepatic duct is only borderline dilated in the distal common bile duct demonstrates normal conical tapering course. No definite enhancing hepatic mass is identified. 3. No specific imaging findings of pancreatitis. 4. Third spacing of fluid with trace ascites, mesenteric edema, and subcutaneous edema. 5. Aortic atherosclerosis, potentially with substantial atherosclerotic narrowing of the proximal right common iliac artery. Electronically Signed   By: Gaylyn Rong M.D.   On: 01/05/2020 09:37     LOS: 3 days   Lanae Boast, MD [facility]  01/06/2020, 12:51 PM

## 2020-01-06 NOTE — Progress Notes (Signed)
Physical Therapy Treatment Patient Details Name: Shawn Davis MRN: [id number] DOB: 02/24/1936 Today's Date: 01/06/2020    History of Present Illness 84 y.o. male with medical history significant of steroid dependent COPD, h/o aspiration and aspiration pneumonia, BPH, protein calorie malnutrition depression admitted to [facility] 8/14 for acute pancreatitis. Recent hx of failure to thrive and weight loss.    PT Comments    Pt assisted with ambulating short distance.  Pt typically only ambulating room to room at home prior to admission.  Pt reports fatigue and dyspnea limiting mobility.   Follow Up Recommendations  Home health PT     Equipment Recommendations  None recommended by PT    Recommendations for Other Services       Precautions / Restrictions Precautions Precautions: Fall Precaution Comments: baseline 2-3L O2 Restrictions Weight Bearing Restrictions: No    Mobility  Bed Mobility Overal bed mobility: Needs Assistance Bed Mobility: Supine to Sit     Supine to sit: Min guard;HOB elevated        Transfers Overall transfer level: Needs assistance Equipment used: Rolling walker (2 wheeled) Transfers: Sit to/from Stand Sit to Stand: Min assist         General transfer comment: verbal cues for hand placement, assist to rise and steady  Ambulation/Gait Ambulation/Gait assistance: Min assist;Min guard Gait Distance (Feet): 14 Feet Assistive device: Rolling walker (2 wheeled) Gait Pattern/deviations: Trunk flexed;Shuffle;Decreased stride length     General Gait Details: verbal cues for safe use of RW, maintained 2L O2 [place] and SpO2 97% upon return to recliner, had pt place [place] in mouth due to mouth breathing to assist with recovery as pt reports moderate dyspnea (daughter reports he has heard this info before), pt fatigues quickly   Stairs             Wheelchair Mobility    Modified Rankin (Stroke Patients Only)       Balance Overall balance  assessment: Needs assistance         Standing balance support: Bilateral upper extremity supported;During functional activity Standing balance-Leahy Scale: Poor                              Cognition Arousal/Alertness: Awake/alert Behavior During Therapy: WFL for tasks assessed/performed                                   General Comments: follows commands      Exercises      General Comments        Pertinent Vitals/Pain Pain Assessment: No/denies pain    Home Living                      Prior Function            PT Goals (current goals can now be found in the care plan section) Progress towards PT goals: Progressing toward goals    Frequency    Min 3X/week      PT Plan Current plan remains appropriate    Co-evaluation              AM-PAC PT "6 Clicks" Mobility   Outcome Measure  Help needed turning from your back to your side while in a flat bed without using bedrails?: None Help needed moving from lying on your back to sitting on the side of a  flat bed without using bedrails?: A Little Help needed moving to and from a bed to a chair (including a wheelchair)?: A Little Help needed standing up from a chair using your arms (e.g., wheelchair or bedside chair)?: A Little Help needed to walk in hospital room?: A Little Help needed climbing 3-5 steps with a railing? : A Lot 6 Click Score: 18    End of Session Equipment Utilized During Treatment: Gait belt;Oxygen Activity Tolerance: Patient limited by fatigue Patient left: with call bell/phone within reach;in chair;with family/visitor present;with chair alarm set   PT Visit Diagnosis: Unsteadiness on feet (R26.81);Muscle weakness (generalized) (M62.81);Difficulty in walking, not elsewhere classified (R26.2)     Time: 3875-6433 PT Time Calculation (min) (ACUTE ONLY): 20 min  Charges:  $Gait Training: 8-22 mins                     Paulino Door, DPT Acute  Rehabilitation Services Pager: [phone] Office: [phone]  Sarajane Jews 01/06/2020, 12:58 PM

## 2020-01-06 NOTE — Care Management Important Message (Signed)
Important Message  Patient Details IM Letter given to the Patient Name: Shawn Davis MRN: [id number] Date of Birth: June 02, 1935   Medicare Important Message Given:  Yes     Caren Macadam 01/06/2020, 10:06 AM

## 2020-01-06 NOTE — Progress Notes (Signed)
Patient ID: Shawn Davis, male   DOB: [birth year], 84 y.o.   MRN: [id number]    Progress Note   Subjective   day # 3  CC; weight loss, dilated ducts, elevated LFTs, severe COPD  ERCP yesterday-segmental moderate gastritis-biopsies taken biliary cannulation difficult, single moderate biliary stenosis in the lower third of main bile duct, dilated and brushed, plastic stent placed  Labs-WBC 5.1, hemoglobin 11.7 stable T bili 1.5/alk phos 214/AST 100/ALT 241  Biopsy and cytology pending  Patient seems to be feeling better today, tolerated clear liquids, no complaints of abdominal pain.  Physical therapy in the room-patient trying to get up to ambulate. Daughter at bedside.  She does not feel that he is ready to be discharged today as needs to be ambulatory.  Patient's elderly wife is primary caregiver    Objective   Vital signs in last 24 hours: Temp:  [96.4 F (35.8 C)-97.9 F (36.6 C)] 97.7 F (36.5 C) (08/17 0531) Pulse Rate:  [63-92] 82 (08/17 0531) Resp:  [12-23] 18 (08/17 0531) BP: (121-142)/(52-84) 139/82 (08/17 0531) SpO2:  [92 %-100 %] 99 % (08/17 0531) Weight:  [47.6 kg] 47.6 kg (08/16 1236) Last BM Date: 01/04/20 General:    Elderly white male in NAD very frail appearing  heart:  Regular rate and rhythm; no murmurs Lungs: Respirations even and unlabored, lungs CTA bilaterally Abdomen:  Soft, nontender and nondistended. Normal bowel sounds. Extremities:  Without edema. Neurologic:  Alert and oriented,  grossly normal neurologically. Psych:  Cooperative. Normal mood and affect.  Intake/Output from previous day: 08/16 0701 - 08/17 0700 In: 892.4 [P.O.:240; I.V.:468.7; IV Piggyback:183.7] Out: 1200 [Urine:1200] Intake/Output this shift: No intake/output data recorded.  Lab Results: Recent Labs    01/04/20 0815 01/05/20 0501 01/06/20 0512  WBC 8.8 5.0 5.1  HGB 13.2 11.5* 11.7*  HCT 41.5 35.1* 35.9*  PLT 222 170 178   BMET Recent Labs    01/04/20 0815  01/05/20 0501 01/06/20 0512  NA 137 136 135  K 4.6 3.7 4.0  CL 97* 97* 98  CO2 '28 30 30  ' GLUCOSE 131* 124* 92  BUN 29* 22 17  CREATININE 0.83 0.63 0.61  CALCIUM 9.0 8.9 8.7*   LFT Recent Labs    01/06/20 0512  PROT 5.6*  ALBUMIN 3.3*  AST 100*  ALT 241*  ALKPHOS 214*  BILITOT 1.5*   PT/INR No results for input(s): LABPROT, INR in the last 72 hours.  Studies/Results: MR 3D Recon At Scanner  Result Date: 01/05/2020 CLINICAL DATA:  Pancreatitis EXAM: MRI ABDOMEN WITHOUT AND WITH CONTRAST (INCLUDING MRCP) TECHNIQUE: Multiplanar multisequence MR imaging of the abdomen was performed both before and after the administration of intravenous contrast. Heavily T2-weighted images of the biliary and pancreatic ducts were obtained, and three-dimensional MRCP images were rendered by post processing. CONTRAST:  9m GADAVIST GADOBUTROL 1 MMOL/ML IV SOLN COMPARISON:  None. FINDINGS: Despite efforts by the technologist and patient, motion artifact is present on today's exam and could not be eliminated. This reduces exam sensitivity and specificity. Lower chest: Subsegmental atelectasis in the posterior basal segments of both lower lobes. Hepatobiliary: Prominent intrahepatic biliary dilatation. In the confluence of the right and left hepatic ducts, there is a suggestion of vague filling defects within the biliary tree, for example on image 20/6, without definite enhancement. This could represent sludge, blood products, tumor, stricture/infection, or flow related artifact. This is followed by a mildly dilated segment of the common hepatic duct at 1.1 cm, which tapers  distally and in the common bile duct as shown on images 19 through 20 of series 6 in a conical fashion towards the ampulla. I do not see a distal filling defect in the common bile duct which is of more normal caliber. Aside from the clearly abnormal biliary tree, no specific individual parenchymal mass is identified Pancreas: Normal caliber and  course of the dorsal pancreatic duct. No findings of pancreatic pseudocyst or abscess. Subject to limitations by the motion artifact, no obvious pancreatic mass is identified. There is low-grade edema tracking along the upper abdominal mesentery but this is commensurate to the subcutaneous edema and appears more generalized, and accordingly we do not demonstrate specific imaging findings of acute pancreatitis. Spleen:  Unremarkable Adrenals/Urinary Tract: Right extrarenal pelvis without hydronephrosis. Adrenal glands unremarkable. Stomach/Bowel: Borderline prominence of colon caliber, constipation not excluded. No dilated small bowel in the visualized region. Vascular/Lymphatic: There is evidence of abdominal aortic atherosclerosis along with potentially substantial atherosclerotic narrowing of the proximal right common iliac artery. Other: Diffuse subcutaneous edema with generalized edema along the mesentery and tracking along the paraspinal musculature. Trace perihepatic and perisplenic ascites. Musculoskeletal: Mild dextroconvex scoliosis in the vicinity of the thoracolumbar junction. Lumbar spondylosis and degenerative disc disease. IMPRESSION: 1. Motion artifact substantially degrades images and adversely affects diagnostic accuracy. 2. Striking degree of intrahepatic biliary dilatation, questionable filling defect(s) at the confluence of the right and left hepatic ducts. Distal to this the common hepatic duct is only borderline dilated in the distal common bile duct demonstrates normal conical tapering course. No definite enhancing hepatic mass is identified. 3. No specific imaging findings of pancreatitis. 4. Third spacing of fluid with trace ascites, mesenteric edema, and subcutaneous edema. 5. Aortic atherosclerosis, potentially with substantial atherosclerotic narrowing of the proximal right common iliac artery. Electronically Signed   By: Van Clines M.D.   On: 01/05/2020 09:37   DG ERCP BILIARY  & PANCREATIC DUCTS  Result Date: 01/05/2020 CLINICAL DATA:  Pancreatitis, intrahepatic biliary ductal dilatation with concern of lesion at the biliary confluence on MRCP. EXAM: ERCP TECHNIQUE: Multiple spot images obtained with the fluoroscopic device and submitted for interpretation post-procedure. COMPARISON:  MRCP from earlier the same day FINDINGS: A series of fluoroscopic spot images document endoscopic cannulation and partial opacification of the pancreatic duct and CBD. Limited opacification of the intrahepatic biliary tree. Balloon catheter passage through the CBD. Plastic stent deployment in the pancreatic duct and CBD. IMPRESSION: 1. Endoscopic intervention as above, with pancreatic and biliary stent placement. These images were submitted for radiologic interpretation only. Please see the procedural report for the amount of contrast and the fluoroscopy time utilized. Electronically Signed   By: Lucrezia Europe M.D.   On: 01/05/2020 16:31   MR ABDOMEN MRCP W WO CONTAST  Result Date: 01/05/2020 CLINICAL DATA:  Pancreatitis EXAM: MRI ABDOMEN WITHOUT AND WITH CONTRAST (INCLUDING MRCP) TECHNIQUE: Multiplanar multisequence MR imaging of the abdomen was performed both before and after the administration of intravenous contrast. Heavily T2-weighted images of the biliary and pancreatic ducts were obtained, and three-dimensional MRCP images were rendered by post processing. CONTRAST:  51m GADAVIST GADOBUTROL 1 MMOL/ML IV SOLN COMPARISON:  None. FINDINGS: Despite efforts by the technologist and patient, motion artifact is present on today's exam and could not be eliminated. This reduces exam sensitivity and specificity. Lower chest: Subsegmental atelectasis in the posterior basal segments of both lower lobes. Hepatobiliary: Prominent intrahepatic biliary dilatation. In the confluence of the right and left hepatic ducts, there is a suggestion  of vague filling defects within the biliary tree, for example on image  20/6, without definite enhancement. This could represent sludge, blood products, tumor, stricture/infection, or flow related artifact. This is followed by a mildly dilated segment of the common hepatic duct at 1.1 cm, which tapers distally and in the common bile duct as shown on images 19 through 20 of series 6 in a conical fashion towards the ampulla. I do not see a distal filling defect in the common bile duct which is of more normal caliber. Aside from the clearly abnormal biliary tree, no specific individual parenchymal mass is identified Pancreas: Normal caliber and course of the dorsal pancreatic duct. No findings of pancreatic pseudocyst or abscess. Subject to limitations by the motion artifact, no obvious pancreatic mass is identified. There is low-grade edema tracking along the upper abdominal mesentery but this is commensurate to the subcutaneous edema and appears more generalized, and accordingly we do not demonstrate specific imaging findings of acute pancreatitis. Spleen:  Unremarkable Adrenals/Urinary Tract: Right extrarenal pelvis without hydronephrosis. Adrenal glands unremarkable. Stomach/Bowel: Borderline prominence of colon caliber, constipation not excluded. No dilated small bowel in the visualized region. Vascular/Lymphatic: There is evidence of abdominal aortic atherosclerosis along with potentially substantial atherosclerotic narrowing of the proximal right common iliac artery. Other: Diffuse subcutaneous edema with generalized edema along the mesentery and tracking along the paraspinal musculature. Trace perihepatic and perisplenic ascites. Musculoskeletal: Mild dextroconvex scoliosis in the vicinity of the thoracolumbar junction. Lumbar spondylosis and degenerative disc disease. IMPRESSION: 1. Motion artifact substantially degrades images and adversely affects diagnostic accuracy. 2. Striking degree of intrahepatic biliary dilatation, questionable filling defect(s) at the confluence of the  right and left hepatic ducts. Distal to this the common hepatic duct is only borderline dilated in the distal common bile duct demonstrates normal conical tapering course. No definite enhancing hepatic mass is identified. 3. No specific imaging findings of pancreatitis. 4. Third spacing of fluid with trace ascites, mesenteric edema, and subcutaneous edema. 5. Aortic atherosclerosis, potentially with substantial atherosclerotic narrowing of the proximal right common iliac artery. Electronically Signed   By: Van Clines M.D.   On: 01/05/2020 09:37   CT OUTSIDE FILMS BODY/ABD/PELVIS  Result Date: 01/04/2020 This examination belongs to an outside facility and is stored here for comparison purposes only.  Contact the originating outside institution for any associated report or interpretation.      Assessment / Plan:    #38 84 year old white male with severe steroid and oxygen dependent COPD, admitted from outside hospital with complaints of weight loss, abdominal pain and noted to have elevated LFTs and dilated ducts on CT done in [place].  MRI/MRCP here with significant intrahepatic ductal dilation, possible filling defects at the confluence common hepatic duct only borderline dilated and distal CBD normal tapering course.  No evidence of pancreatitis by MRI.  ERCP yesterday with finding of biliary stricture, brushed, sludge extracted and plastic stent placed in the pancreatic duct.  Patient stable today, no complaints of abdominal pain and tolerating clear liquids.  Brushings pending Continues on Zosyn today  Gallbladder did not completely fill at ERCP.  Clinically patient does not have any evidence of cholecystitis.  Will discuss with Dr. Stefani Dama Roddy.  Patient is very high risk surgical candidate.  Plan; advance to full liquids.  Patient had been on pured/chopped diet at home due to dysphagia issues.  Observed today Repeat labs in a.m. Hopefully cytology will be out  tomorrow. Patient will need follow-up KUB in 10 to 14 days  to assess pancreatic duct stent migration, if retained will need repeat EGD to pull.    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

## 2020-01-06 NOTE — Anesthesia Postprocedure Evaluation (Signed)
Anesthesia Post Note  Patient: Shawn Davis  Procedure(s) Performed: ENDOSCOPIC RETROGRADE CHOLANGIOPANCREATOGRAPHY (ERCP) (N/A ) ESOPHAGOGASTRODUODENOSCOPY (EGD) (N/A ) BIOPSY SPHINCTEROTOMY BILIARY BRUSHING PANCREATIC STENT PLACEMENT BILIARY STENT PLACEMENT (N/A ) BILIARY DILATION REMOVAL OF STONES     Patient location during evaluation: PACU Anesthesia Type: General Level of consciousness: awake and alert Pain management: pain level controlled Vital Signs Assessment: post-procedure vital signs reviewed and stable Respiratory status: spontaneous breathing, nonlabored ventilation and respiratory function stable Cardiovascular status: blood pressure returned to baseline and stable Postop Assessment: no apparent nausea or vomiting Anesthetic complications: no   No complications documented.  Last Vitals:  Vitals:   01/05/20 2129 01/06/20 0531  BP:  139/82  Pulse:  82  Resp:  18  Temp:  36.5 C  SpO2: 96% 99%    Last Pain:  Vitals:   01/06/20 0531  TempSrc: Oral  PainSc:                  [name]

## 2020-01-06 NOTE — TOC Initial Note (Signed)
Transition of Care [facility]) - Initial/Assessment Note    Patient Details  Name: Shawn Davis MRN: [id number] Date of Birth: 12-17-1935  Transition of Care (TOC) CM/SW Contact:    Armanda Heritage, RN Phone Number: 01/06/2020, 12:04 PM  Clinical Narrative:  CM spoke with patient regarding recommendation for HHPT.  Patient reports he will discuss with his wife and think about this.  Patient is uncertain at this time that he wants [facility] services. Patient instructed that should he change his mind and wish for [facility] services to notify his bedside RN prior to discharge.                  Expected Discharge Plan: Home w Home Health Services Barriers to Discharge: Continued Medical Work up   Patient Goals and CMS Choice Patient states their goals for this hospitalization and ongoing recovery are:: to go home CMS Medicare.gov Compare Post Acute Care list provided to:: Patient Choice offered to / list presented to : Patient  Expected Discharge Plan and Services Expected Discharge Plan: Home w Home Health Services   Discharge Planning Services: CM Consult Post Acute Care Choice: Home Health Living arrangements for the past 2 months: Single Family Home                 DME Arranged: N/A DME Agency: NA                  Prior Living Arrangements/Services Living arrangements for the past 2 months: Single Family Home Lives with:: Spouse Patient language and need for interpreter reviewed:: Yes Do you feel safe going back to the place where you live?: Yes      Need for Family Participation in Patient Care: Yes (Comment) Care giver support system in place?: Yes (comment)   Criminal Activity/Legal Involvement Pertinent to Current Situation/Hospitalization: No - Comment as needed  Activities of Daily Living Home Assistive Devices/Equipment: Eyeglasses, Environmental consultant (specify type), Wheelchair, Dentures (specify type) ADL Screening (condition at time of admission) Patient's cognitive ability  adequate to safely complete daily activities?: Yes Is the patient deaf or have difficulty hearing?: No Does the patient have difficulty seeing, even when wearing glasses/contacts?: No Does the patient have difficulty concentrating, remembering, or making decisions?: No Patient able to express need for assistance with ADLs?: Yes Does the patient have difficulty dressing or bathing?: Yes Independently performs ADLs?: No Communication: Independent Dressing (OT): Needs assistance Is this a change from baseline?: Pre-admission baseline Grooming: Needs assistance Is this a change from baseline?: Pre-admission baseline Feeding: Needs assistance Is this a change from baseline?: Pre-admission baseline Bathing: Dependent Is this a change from baseline?: Pre-admission baseline Toileting: Dependent Is this a change from baseline?: Pre-admission baseline In/Out Bed: Needs assistance Is this a change from baseline?: Pre-admission baseline Walks in Home: Needs assistance Is this a change from baseline?: Pre-admission baseline Does the patient have difficulty walking or climbing stairs?: Yes Weakness of Legs: Both Weakness of Arms/Hands: None  Permission Sought/Granted                  Emotional Assessment Appearance:: Appears stated age         Psych Involvement: No (comment)  Admission diagnosis:  Pancreatitis, acute [K85.90] Patient Active Problem List   Diagnosis Date Noted  . Protein-calorie malnutrition, severe 01/05/2020  . Pancreatitis, acute 01/03/2020  . Steroid-dependent COPD (HCC) 01/03/2020  . BPH (benign prostatic hyperplasia) 01/03/2020  . Chronic pulmonary aspiration 01/03/2020   PCP:  Marlise Eves, MD  Pharmacy:   [facility], [place] - [address] [place] [address] Phone: [phone] Fax: [phone]     Social Determinants of Health (SDOH) Interventions    Readmission Risk Interventions No flowsheet data  found.

## 2020-01-07 ENCOUNTER — Other Ambulatory Visit: Payer: Self-pay

## 2020-01-07 ENCOUNTER — Encounter (HOSPITAL_COMMUNITY): Payer: Self-pay | Admitting: Gastroenterology

## 2020-01-07 ENCOUNTER — Inpatient Hospital Stay (HOSPITAL_COMMUNITY): Payer: Medicare Other

## 2020-01-07 DIAGNOSIS — R932 Abnormal findings on diagnostic imaging of liver and biliary tract: Secondary | ICD-10-CM

## 2020-01-07 LAB — HEPATIC FUNCTION PANEL
ALT: 211 U/L — ABNORMAL HIGH (ref 0–44)
AST: 86 U/L — ABNORMAL HIGH (ref 15–41)
Albumin: 3.3 g/dL — ABNORMAL LOW (ref 3.5–5.0)
Alkaline Phosphatase: 198 U/L — ABNORMAL HIGH (ref 38–126)
Bilirubin, Direct: 0.6 mg/dL — ABNORMAL HIGH (ref 0.0–0.2)
Indirect Bilirubin: 0.9 mg/dL (ref 0.3–0.9)
Total Bilirubin: 1.5 mg/dL — ABNORMAL HIGH (ref 0.3–1.2)
Total Protein: 5.5 g/dL — ABNORMAL LOW (ref 6.5–8.1)

## 2020-01-07 LAB — CYTOLOGY - NON PAP

## 2020-01-07 LAB — SURGICAL PATHOLOGY

## 2020-01-07 NOTE — Progress Notes (Signed)
[facility] Progress Note  CC:  Elevated LFTs  Subjective:  He is fatigued but feels better today. No N/V. He has slight discomfort to his upper esophagus when he swallows which he stated was present prior to his hospital admission. No abdominal pain. He passed a small formed BM earlier today as reported by his RN.   Objective:   Vital signs in last 24 hours: Temp:  [98.6 F (37 C)-99.2 F (37.3 C)] 99.2 F (37.3 C) (08/18 1348) Pulse Rate:  [85-90] 90 (08/18 1348) Resp:  [18] 18 (08/18 1348) BP: (122-158)/(75-92) 122/75 (08/18 1348) SpO2:  [95 %-100 %] 100 % (08/18 1348) Last BM Date: 01/04/20 General: Fatigued appearing 84 year old male in NAD.  Heart: RRR, no murmur.  Pulm:  Diminished breath sounds throughout.  Abdomen: Soft, nondistended. Nontender. + BS x 4 quads. No HSM. Extremities:  Without edema. Neurologic:  Alert and  oriented x4;  Decreased strength x 4 extremities.  Psych:  Alert and cooperative. Normal mood and affect.  Intake/Output from previous day: 08/17 0701 - 08/18 0700 In: 679.9 [P.O.:540; IV Piggyback:139.9] Out: 1252 [Urine:1251; Stool:1] Intake/Output this shift: Total I/O In: 60 [P.O.:60] Out: 200 [Urine:200]  Lab Results: Recent Labs    01/05/20 0501 01/06/20 0512  WBC 5.0 5.1  HGB 11.5* 11.7*  HCT 35.1* 35.9*  PLT 170 178   BMET Recent Labs    01/05/20 0501 01/06/20 0512  NA 136 135  K 3.7 4.0  CL 97* 98  CO2 30 30  GLUCOSE 124* 92  BUN 22 17  CREATININE 0.63 0.61  CALCIUM 8.9 8.7*   LFT Recent Labs    01/07/20 0512  PROT 5.5*  ALBUMIN 3.3*  AST 86*  ALT 211*  ALKPHOS 198*  BILITOT 1.5*  BILIDIR 0.6*  IBILI 0.9   PT/INR No results for input(s): LABPROT, INR in the last 72 hours. Hepatitis Panel No results for input(s): HEPBSAG, HCVAB, HEPAIGM, HEPBIGM in the last 72 hours.  DG Abd 1 View  Result Date: 01/07/2020 CLINICAL DATA:  Pancreatic stent migration EXAM: ABDOMEN - 1 VIEW COMPARISON:   ERCP January 05, 2020 FINDINGS: There are stents in the mid abdomen predominantly to the right of midline. There is no bowel dilatation or air-fluid level to suggest bowel obstruction. No free air. Visualized lung bases are clear. There is aortic atherosclerosis. IMPRESSION: Stents primarily to the right of midline in the mid abdominal region at the approximate L3-L4 level. Exact location of the stents cannot be ascertained on frontal radiographic view. It may be prudent to consider CT to more precisely assess stent location. No bowel obstruction or free air evident. Aortic Atherosclerosis (ICD10-I70.0). Electronically Signed   By: Bretta Bang III M.D.   On: 01/07/2020 08:16   DG ERCP BILIARY & PANCREATIC DUCTS  Result Date: 01/05/2020 CLINICAL DATA:  Pancreatitis, intrahepatic biliary ductal dilatation with concern of lesion at the biliary confluence on MRCP. EXAM: ERCP TECHNIQUE: Multiple spot images obtained with the fluoroscopic device and submitted for interpretation post-procedure. COMPARISON:  MRCP from earlier the same day FINDINGS: A series of fluoroscopic spot images document endoscopic cannulation and partial opacification of the pancreatic duct and CBD. Limited opacification of the intrahepatic biliary tree. Balloon catheter passage through the CBD. Plastic stent deployment in the pancreatic duct and CBD. IMPRESSION: 1. Endoscopic intervention as above, with pancreatic and biliary stent placement. These images were submitted for radiologic interpretation only. Please see the procedural report for the amount of  contrast and the fluoroscopy time utilized. Electronically Signed   By: Corlis Leak M.D.   On: 01/05/2020 16:31    Assessment / Plan:  33. 84 year old male with admitted to [facility] on 01/04/2020 with abnormal LFTs/biliary duct dilation on CT scan done at [facility].  MRCP 8/16  showed intrahepatic biliary dilatation, distal CBD was borderline dilated. S/P ERCP by Dr. Meridee Score on 8/16  which showed gastritis, a moderate biliary stenosis with sludge or stones in the middle third of the main bile duct which required a sphincterotomy, biliary and temporary pancreatic stent. A repeat KUB today showed appropriate placement of both stents. He is afebrile. Hemodynamically stable.  -Ok for discharge to SNF/rehab, most likely tomorrow -Repeat hepatic panel in am -Repeat KUB to assess pancreatic duct placement in 2 weeks, if his pancreatic duct stent remains in place he will need a follow up ERCP to remove it. -Await biliary biopsy reults. Further recommendations to be determined after bilary duct brushing pathology results received.  -Patient to decide if he will establish a GI closer to his home in [place] or to return to our office in [place]. It is imperative for him to have appropriate follow up and eventual removal of the biliary stent.   2. Significantly malnourished/recent weight loss -He will require ongoing nutrition support   3. Severe COPD  Active Problems:   Pancreatitis, acute   Steroid-dependent COPD (HCC)   BPH (benign prostatic hyperplasia)   Chronic pulmonary aspiration   Protein-calorie malnutrition, severe     LOS: 4 days   Arnaldo Natal  01/07/2020, 2:29 PM

## 2020-01-07 NOTE — TOC Progression Note (Addendum)
Transition of Care [facility]) - Progression Note    Patient Details  Name: YORDI KRAGER MRN: [id number] Date of Birth: September 17, 1935  [facility] CM/SW Contact  Golda Acre, RN Phone Number: 01/07/2020, 12:19 PM  Clinical Narrative:    dughter wants him to go to snf as recommended by p.t./ passar number [phone] A [phone] fl2 send out to area snf via the hub. tct-maggie [place] at the [employer] for snf approval at [phone] message left on recorder. tct-daughter- would like for patient to go [place] [facility] [place],  [place] Lorrine Kin at [phone] left on voice mail to please return call. tcf-[place] from the [facility] [place] not have snf benefits with his [place] benefits. Will use the [employer]. tct-[place] [facility] in the admission. Will fax information to [phone]. Expected Discharge Plan: Skilled Nursing Facility Barriers to Discharge: Continued Medical Work up  Expected Discharge Plan and Services Expected Discharge Plan: Skilled Nursing Facility   Discharge Planning Services: CM Consult Post Acute Care Choice: Home Health Living arrangements for the past 2 months: Single Family Home                 DME Arranged: N/A DME Agency: NA                   Social Determinants of Health (SDOH) Interventions    Readmission Risk Interventions No flowsheet data found.

## 2020-01-07 NOTE — [place] FL2 (Signed)
[place] MEDICAID FL2 LEVEL OF CARE SCREENING TOOL     IDENTIFICATION  Patient Name: Shawn Davis Birthdate: December 20, 1935 Sex: male Admission Date (Current Location): 01/03/2020  [facility] [place] and [place] Number:  Producer, television/film/video and Address:  [facility],  [address] [place] [address]      Provider Number: [id number]  Attending Physician Name and Address:  Lanae Boast, MD  Relative Name and Phone Number:       Current Level of Care: Hospital Recommended Level of Care: Skilled Nursing Facility Prior Approval Number:    Date Approved/Denied:   PASRR Number: [id number]  Discharge Plan: SNF    Current Diagnoses: Patient Active Problem List   Diagnosis Date Noted  . Protein-calorie malnutrition, severe 01/05/2020  . Pancreatitis, acute 01/03/2020  . Steroid-dependent COPD (HCC) 01/03/2020  . BPH (benign prostatic hyperplasia) 01/03/2020  . Chronic pulmonary aspiration 01/03/2020    Orientation RESPIRATION BLADDER Height & Weight     Self, Time, Situation, Place  Normal Continent Weight: 47.6 kg Height:  5\' 10"  (177.8 cm)  BEHAVIORAL SYMPTOMS/MOOD NEUROLOGICAL BOWEL NUTRITION STATUS      Continent Diet (regular)  AMBULATORY STATUS COMMUNICATION OF NEEDS Skin   Extensive Assist Verbally Normal                       Personal Care Assistance Level of Assistance  Bathing, Feeding, Dressing Bathing Assistance: Limited assistance Feeding assistance: Limited assistance Dressing Assistance: Limited assistance     Functional Limitations Info  Sight, Speech, Hearing Sight Info: Adequate Hearing Info: Adequate Speech Info: Adequate    SPECIAL CARE FACTORS FREQUENCY  PT (By licensed PT), OT (By licensed OT)     PT Frequency: 5x weekly OT Frequency: 5x weekly            Contractures Contractures Info: Not present    Additional Factors Info  Code Status Code Status Info: DNR             Current Medications (01/07/2020):  This  is the current hospital active medication list Current Facility-Administered Medications  Medication Dose Route Frequency Provider Last Rate Last Admin  . acetaminophen (TYLENOL) tablet 650 mg  650 mg Oral Q6H PRN Norins, 01/09/2020, MD       Or  . acetaminophen (TYLENOL) suppository 650 mg  650 mg Rectal Q6H PRN Norins, Rosalyn Gess, MD      . albuterol (PROVENTIL) (2.5 MG/3ML) 0.083% nebulizer solution 2.5 mg  2.5 mg Nebulization Q6H PRN Norins, Rosalyn Gess, MD   2.5 mg at 01/03/20 2342  . arformoterol (BROVANA) nebulizer solution 15 mcg  15 mcg Nebulization BID 2343, MD   15 mcg at 01/07/20 1110  . feeding supplement (ENSURE ENLIVE) (ENSURE ENLIVE) liquid 237 mL  237 mL Oral TID BM Kc, Ramesh, MD   237 mL at 01/07/20 0955  . finasteride (PROSCAR) tablet 5 mg  5 mg Oral Daily Norins, 01/09/20, MD   5 mg at 01/07/20 1006  . FLUoxetine (PROZAC) capsule 40 mg  40 mg Oral Daily Norins, 01/09/20, MD   40 mg at 01/07/20 1006  . oxyCODONE (Oxy IR/ROXICODONE) immediate release tablet 5 mg  5 mg Oral Q4H PRN Norins, 01/09/20, MD      . senna (SENOKOT) tablet 8.6 mg  1 tablet Oral BID Norins, Rosalyn Gess, MD   8.6 mg at 01/07/20 1006  . tamsulosin (FLOMAX) capsule 0.4 mg  0.4 mg Oral Daily  Norins, Rosalyn Gess, MD   0.4 mg at 01/07/20 1006     Discharge Medications: Please see discharge summary for a list of discharge medications.  Relevant Imaging Results:  Relevant Lab Results:   Additional Information SSN:[id number]  Golda Acre, RN

## 2020-01-07 NOTE — Progress Notes (Signed)
PROGRESS NOTE    Shawn Davis  DDU:[id number] DOB: [birth year] DOA: 01/03/2020 PCP: Marlise Eves, MD   Chief complaint: FTT  Brief Narrative: 84 y.o. male with medical history significant of steroid dependent COPD, h/o aspiration and aspiration pneumonia, BPH, protein calorie malnutrition depression who has had rapid weight loss and failure to thrive and sent to the ED at [facility] 01/01/20 where work-up showed CT scan showing intra-/extrahepatic duct dilatation diagnosing acute pancreatitis and transferred to [facility] long hospital 01/03/20. Status post ERCP, biliary stricture brushed, sludge extracted and plastic stent placed in the pancreatic duct.LFTs downtrending. LFTs downtrending.    Subjective: Waking up appears ill frail not in acute distress.  Complains of pain all over.  Appears very weak and frail.  Wife at the bedside  Assessment & Plan:  Abnormal LFTs with dilated biliary tree secondary to biliary stricture: At outside facility diagnosed as pancreatitis:MRCP  with intrahepatic biliary dilatation, questionable filling defects at the confluence of the right and left hepatic ducts, third spacing of fluid with trace ascites, mesenteric edema and subcutaneous edema.  Status post ERCP, biliary stricture brushed, sludge extracted and plastic stent placed in the pancreatic duct.  Liver function tests are improving.  KUB done this morning-however given further GI input.  Patient overall appears ill and frail.  His cytology is pending.  He finished postop antibiotics.   Failure to thrive/rapid weight loss:poor oral intake for few months, with chronic medical condition/copd/chronic hypoxic respiratory failure.  Continue to augment nutrition.  Encourage PT OT.  He may need skilled nursing facility placement, discussed with patient's wife and daughter who agrees with SNF -I have requested TOC consult.   COPD/Chronic hypoxic respiratory failure on 2-3 l [place].per daughter  "He has trouble breathing all the time" and has had recent hospitalization with subsequent decline failure to thrive: Continue inhaler.  BPH: Stable, continue Flomax and finasteride  Chronic pulmonary aspiration history followed by speech and nutrition at home.  Continue on aspiration precaution, full liquid diet and advance as tolerated.  Underweight severe protein calorie malnutrition- augment diet.  GOC: DNR.  At risk of decompensation and recurrent hospitalization   DVT prophylaxis:SCD and consider prophylaxis if no procedure Code Status:   Code Status: DNR  Family Communication: plan of care discussed with patient and his daughter at the bedside.  Does not feel ready for discharge yet and requesting skilled nursing facility placement.  Status is: Inpatient Remains inpatient appropriate because:Ongoing diagnostic testing needed not appropriate for outpatient work up and IV treatments appropriate due to intensity of illness or inability to take PO Dispo:The patient is from: Home            Anticipated d/c is to: SNF            Anticipated d/c date is: 1 day, with SNF            Patient currently is not medically stable to d/c.   Nutrition: Diet Order            Diet full liquid Room service appropriate? Yes; Fluid consistency: Thin  Diet effective now                 Body mass index is 15.07 kg/m.  Consultants:see note  Procedures:see note Microbiology:see note Blood Culture No results found for: SDES, SPECREQUEST, CULT, REPTSTATUS  Other culture-see note  Medications: Scheduled Meds: . arformoterol  15 mcg Nebulization BID  . feeding supplement (ENSURE ENLIVE)  237 mL  Oral TID BM  . finasteride  5 mg Oral Daily  . FLUoxetine  40 mg Oral Daily  . senna  1 tablet Oral BID  . tamsulosin  0.4 mg Oral Daily   Continuous Infusions:   Antimicrobials: Anti-infectives (From admission, onward)   Start     Dose/Rate Route Frequency Ordered Stop   01/04/20 0000   piperacillin-tazobactam (ZOSYN) IVPB 3.375 g  Status:  Discontinued        3.375 g 100 mL/hr over 30 Minutes Intravenous Every 6 hours 01/03/20 2230 01/03/20 2235   01/03/20 2245  piperacillin-tazobactam (ZOSYN) IVPB 3.375 g        3.375 g 12.5 mL/hr over 240 Minutes Intravenous Every 8 hours 01/03/20 2235 01/06/20 2359       Objective: Vitals: Today's Vitals   01/06/20 2005 01/07/20 0558 01/07/20 0734 01/07/20 1110  BP: (!) 142/92 (!) 158/84    Pulse: 85 87    Resp: 18 18    Temp: 98.8 F (37.1 C) 98.6 F (37 C)    TempSrc: Oral Oral    SpO2: 98% 98%  97%  Weight:      Height:      PainSc:   0-No pain     Intake/Output Summary (Last 24 hours) at 01/07/2020 1119 Last data filed at 01/07/2020 1000 Gross per 24 hour  Intake 599.88 ml  Output 1352 ml  Net -752.12 ml   Filed Weights   01/05/20 1236  Weight: 47.6 kg   Weight change:    Intake/Output from previous day: 08/17 0701 - 08/18 0700 In: 679.9 [P.O.:540; IV Piggyback:139.9] Out: 1252 [Urine:1251; Stool:1] Intake/Output this shift: Total I/O In: 60 [P.O.:60] Out: 200 [Urine:200]  Examination:  General exam: AAO, ill and frail on oxygen, NAD, weak appearing. HEENT:Oral mucosa moist, Ear/Nose WNL grossly, dentition normal. Respiratory system: bilaterally diminished breath sound,no use of accessory muscle Cardiovascular system: S1 & S2 +, No JVD,. Gastrointestinal system: Abdomen soft, NT,ND, BS+ Nervous System:Alert, awake, moving extremities and grossly nonfocal Extremities: No edema, distal peripheral pulses palpable.  Skin: No rashes,no icterus. MSK: Normal muscle bulk,tone, power   Data Reviewed: I have personally reviewed following labs and imaging studies CBC: Recent Labs  Lab 01/03/20 2256 01/04/20 0815 01/05/20 0501 01/06/20 0512  WBC 9.6 8.8 5.0 5.1  NEUTROABS 8.8*  --   --   --   HGB 14.0 13.2 11.5* 11.7*  HCT 44.7 41.5 35.1* 35.9*  MCV 100.7* 101.2* 98.3 98.9  PLT 223 222 170 178    Basic Metabolic Panel: Recent Labs  Lab 01/03/20 2256 01/04/20 0815 01/05/20 0501 01/06/20 0512  NA 137 137 136 135  K 5.0 4.6 3.7 4.0  CL 99 97* 97* 98  CO2 25 28 30 30   GLUCOSE 94 131* 124* 92  BUN 28* 29* 22 17  CREATININE 0.83 0.83 0.63 0.61  CALCIUM 9.4 9.0 8.9 8.7*  MG  --  2.0  --   --    GFR: Estimated Creatinine Clearance: 47.9 mL/min (by C-G formula based on SCr of 0.61 mg/dL). Liver Function Tests: Recent Labs  Lab 01/03/20 2256 01/04/20 0815 01/05/20 0501 01/06/20 0512 01/07/20 0512  AST 143* 132* 121* 100* 86*  ALT 311* 293* 255* 241* 211*  ALKPHOS 307* 273* 223* 214* 198*  BILITOT 3.4* 2.4* 2.3* 1.5* 1.5*  PROT 6.4* 5.9* 5.3* 5.6* 5.5*  ALBUMIN 3.7 3.5 3.2* 3.3* 3.3*   Recent Labs  Lab 01/03/20 2256 01/04/20 0815  LIPASE 18 22  AMYLASE  48  --    No results for input(s): AMMONIA in the last 168 hours. Coagulation Profile: No results for input(s): INR, PROTIME in the last 168 hours. Cardiac Enzymes: No results for input(s): CKTOTAL, CKMB, CKMBINDEX, TROPONINI in the last 168 hours. BNP (last 3 results) No results for input(s): PROBNP in the last 8760 hours. HbA1C: No results for input(s): HGBA1C in the last 72 hours. CBG: No results for input(s): GLUCAP in the last 168 hours. Lipid Profile: No results for input(s): CHOL, HDL, LDLCALC, TRIG, CHOLHDL, LDLDIRECT in the last 72 hours. Thyroid Function Tests: No results for input(s): TSH, T4TOTAL, FREET4, T3FREE, THYROIDAB in the last 72 hours. Anemia Panel: No results for input(s): VITAMINB12, FOLATE, FERRITIN, TIBC, IRON, RETICCTPCT in the last 72 hours. Sepsis Labs: No results for input(s): PROCALCITON, LATICACIDVEN in the last 168 hours.  Recent Results (from the past 240 hour(s))  SARS Coronavirus 2 by RT PCR (hospital order, performed in [facility] hospital lab) Nasopharyngeal Nasopharyngeal Swab     Status: None   Collection Time: 01/04/20 11:05 AM   Specimen: Nasopharyngeal Swab   Result Value Ref Range Status   SARS Coronavirus 2 NEGATIVE NEGATIVE Final    Comment: (NOTE) SARS-CoV-2 target nucleic acids are NOT DETECTED.  The SARS-CoV-2 RNA is generally detectable in upper and lower respiratory specimens during the acute phase of infection. The lowest concentration of SARS-CoV-2 viral copies this assay can detect is 250 copies / mL. A negative result does not preclude SARS-CoV-2 infection and should not be used as the sole basis for treatment or other patient management decisions.  A negative result may occur with improper specimen collection / handling, submission of specimen other than nasopharyngeal swab, presence of viral mutation(s) within the areas targeted by this assay, and inadequate number of viral copies (<250 copies / mL). A negative result must be combined with clinical observations, patient history, and epidemiological information.  Fact Sheet for Patients:   BoilerBrush.com.cy  Fact Sheet for Healthcare Providers: https://pope.com/  This test is not yet approved or  cleared by the [place] FDA and has been authorized for detection and/or diagnosis of SARS-CoV-2 by FDA under an Emergency Use Authorization (EUA).  This EUA will remain in effect (meaning this test can be used) for the duration of the COVID-19 declaration under Section 564(b)(1) of the Act, 21 U.S.C. section 360bbb-3(b)(1), unless the authorization is terminated or revoked sooner.  Performed at [facility], [address] [place], [place] [address]       Radiology Studies: DG Abd 1 View  Result Date: 01/07/2020 CLINICAL DATA:  Pancreatic stent migration EXAM: ABDOMEN - 1 VIEW COMPARISON:  ERCP January 05, 2020 FINDINGS: There are stents in the mid abdomen predominantly to the right of midline. There is no bowel dilatation or air-fluid level to suggest bowel obstruction. No free air. Visualized lung  bases are clear. There is aortic atherosclerosis. IMPRESSION: Stents primarily to the right of midline in the mid abdominal region at the approximate L3-L4 level. Exact location of the stents cannot be ascertained on frontal radiographic view. It may be prudent to consider CT to more precisely assess stent location. No bowel obstruction or free air evident. Aortic Atherosclerosis (ICD10-I70.0). Electronically Signed   By: Bretta Bang III M.D.   On: 01/07/2020 08:16   DG ERCP BILIARY & PANCREATIC DUCTS  Result Date: 01/05/2020 CLINICAL DATA:  Pancreatitis, intrahepatic biliary ductal dilatation with concern of lesion at the biliary confluence on MRCP. EXAM: ERCP  TECHNIQUE: Multiple spot images obtained with the fluoroscopic device and submitted for interpretation post-procedure. COMPARISON:  MRCP from earlier the same day FINDINGS: A series of fluoroscopic spot images document endoscopic cannulation and partial opacification of the pancreatic duct and CBD. Limited opacification of the intrahepatic biliary tree. Balloon catheter passage through the CBD. Plastic stent deployment in the pancreatic duct and CBD. IMPRESSION: 1. Endoscopic intervention as above, with pancreatic and biliary stent placement. These images were submitted for radiologic interpretation only. Please see the procedural report for the amount of contrast and the fluoroscopy time utilized. Electronically Signed   By: Corlis Leak M.D.   On: 01/05/2020 16:31     LOS: 4 days   Lanae Boast, MD [facility]  01/07/2020, 11:19 AM

## 2020-01-07 NOTE — Progress Notes (Signed)
Occupational Therapy Treatment Patient Details Name: Shawn Davis MRN: [id number] DOB: 25-Aug-1935 Today's Date: 01/07/2020    History of present illness 84 y.o. male with medical history significant of steroid dependent COPD, h/o aspiration and aspiration pneumonia, BPH, protein calorie malnutrition depression admitted to [facility] 8/14 for acute pancreatitis. Recent hx of failure to thrive and weight loss.   OT comments  Patient presents today with increased confusion and wheezing compared to evaluation. Treatment focused on improving activity tolerance and functional mobility. Patient mod assist for transfer to side of bed and exhibited increased use of upper extremity propping at side of bed to maintain balance. Patient min assist to stand from bed and walked 5 feet to stand at sink in bathroom. Patient tolerated approx 20 seconds of standing to wash face. Patient requested return to bed due to fatigue. Patient required use of RW - which he typically doesn't use - and min assist from therapist for steadying due to mild imbalances. Patient exhibited mild to moderate dyspnea with minimal activity and placing [place] in mouth. Patient's o2 sat 94%-98% during activity. Patient ambulated approx 10 feet with complaints of fatigue. Patient has not demonstrated improvement since evaluation and actually exhibits decreased balance and activity tolerance. Therapist now recommends short term rehab at discharge. Discussed POC with spouse in room and with RN at end of treatment.    Follow Up Recommendations  SNF    Equipment Recommendations  None recommended by OT    Recommendations for Other Services      Precautions / Restrictions Precautions Precautions: Fall Precaution Comments: baseline 2-3L O2 Restrictions Weight Bearing Restrictions: No       Mobility Bed Mobility Overal bed mobility: Needs Assistance Bed Mobility: Supine to Sit;Sit to Supine     Supine to sit: HOB elevated;Mod assist Sit to  supine: Supervision   General bed mobility comments: Mod assist to transfer to side of bed needing assistane for LEs and trunk lift off. Patient needed verbal and tactile cues to scoot to the edge of the bed. After ambulation patient returned to sitting at side of bed. Patient supervision to return to supine.  Transfers Overall transfer level: Needs assistance Equipment used: Rolling walker (2 wheeled) Transfers: Sit to/from Stand Sit to Stand: Min assist Stand pivot transfers: Min assist       General transfer comment: Min assist to rise and steady. Steadying assist for 5 foot ambulation to sink in bathroom, with standing during grooming and with return to supine. [place] requests need to sit down. Moderate dyspnea durnig activity though o2 sats maintain 94% and above on 1.5-2 L.    Balance Overall balance assessment: Needs assistance Sitting-balance support: Bilateral upper extremity supported;Feet supported Sitting balance-Leahy Scale: Poor Sitting balance - Comments: Decreased sitting balance today. patiient propping with UEs   Standing balance support: Bilateral upper extremity supported;During functional activity Standing balance-Leahy Scale: Poor Standing balance comment: [place] on walker and still needing assistance from therapist to steady.                           ADL either performed or assessed with clinical judgement   ADL       Grooming: Standing;Wash/dry face Grooming Details (indicate cue type and reason): Patient stood at sink with min assist from therapist for steadying. patient washed face. Patient tolerated approx 20 seconds of ADL activity before requesting to go back to bed.  Vision   Vision Assessment?: No apparent visual deficits   Perception     Praxis      Cognition Arousal/Alertness: Awake/alert Behavior During Therapy: WFL for tasks assessed/performed Overall Cognitive Status:  History of cognitive impairments - at baseline                                 General Comments: Decreased orientation and short term memory. Increased confusion compared to evaluation. Patient seeing a chair on the ceiling.        Exercises     Shoulder Instructions       General Comments      Pertinent Vitals/ Pain       Pain Assessment: No/denies pain  Home Living                                          Prior Functioning/Environment              Frequency  Min 2X/week        Progress Toward Goals  OT Goals(current goals can now be found in the care plan section)  Progress towards OT goals: Progressing toward goals  Acute Rehab OT Goals Patient Stated Goal: Did not state OT Goal Formulation: With patient Time For Goal Achievement: 01/18/20 Potential to Achieve Goals: Fair  Plan Discharge plan needs to be updated    Co-evaluation          OT goals addressed during session: ADL's and self-care;Strengthening/ROM (functional mobility)      AM-PAC OT "6 Clicks" Daily Activity     Outcome Measure   Help from another person eating meals?: None Help from another person taking care of personal grooming?: A Little Help from another person toileting, which includes using toliet, bedpan, or urinal?: A Lot Help from another person bathing (including washing, rinsing, drying)?: A Lot Help from another person to put on and taking off regular upper body clothing?: A Little Help from another person to put on and taking off regular lower body clothing?: A Lot 6 Click Score: 16    End of Session Equipment Utilized During Treatment: Gait belt;Rolling walker;Oxygen  OT Visit Diagnosis: Unsteadiness on feet (R26.81);Adult, failure to thrive (R62.7)   Activity Tolerance Patient limited by fatigue   Patient Left in bed;with call bell/phone within reach;with bed alarm set;with family/visitor present   Nurse Communication Mobility  status (Has not improved, POC changed to SNF.)        Time: 6606-3016 OT Time Calculation (min): 23 min  Charges: OT General Charges $OT Visit: 1 Visit OT Treatments $Self Care/Home Management : 8-22 mins $Therapeutic Activity: 8-22 mins  Waldron Session, OTR/L Acute Care Rehab Services  Office [phone] Pager: [phone]    Kelli Churn 01/07/2020, 11:21 AM

## 2020-01-08 ENCOUNTER — Encounter: Payer: Self-pay | Admitting: Gastroenterology

## 2020-01-08 ENCOUNTER — Telehealth: Payer: Self-pay

## 2020-01-08 DIAGNOSIS — N400 Enlarged prostate without lower urinary tract symptoms: Secondary | ICD-10-CM

## 2020-01-08 DIAGNOSIS — T85520S Displacement of bile duct prosthesis, sequela: Secondary | ICD-10-CM

## 2020-01-08 DIAGNOSIS — K859 Acute pancreatitis without necrosis or infection, unspecified: Secondary | ICD-10-CM

## 2020-01-08 DIAGNOSIS — K839 Disease of biliary tract, unspecified: Secondary | ICD-10-CM

## 2020-01-08 LAB — CBC
HCT: 37.7 % — ABNORMAL LOW (ref 39.0–52.0)
Hemoglobin: 12.2 g/dL — ABNORMAL LOW (ref 13.0–17.0)
MCH: 32.3 pg (ref 26.0–34.0)
MCHC: 32.4 g/dL (ref 30.0–36.0)
MCV: 99.7 fL (ref 80.0–100.0)
Platelets: 190 10*3/uL (ref 150–400)
RBC: 3.78 MIL/uL — ABNORMAL LOW (ref 4.22–5.81)
RDW: 14 % (ref 11.5–15.5)
WBC: 7.5 10*3/uL (ref 4.0–10.5)
nRBC: 0 % (ref 0.0–0.2)

## 2020-01-08 LAB — COMPREHENSIVE METABOLIC PANEL
ALT: 184 U/L — ABNORMAL HIGH (ref 0–44)
AST: 78 U/L — ABNORMAL HIGH (ref 15–41)
Albumin: 3.1 g/dL — ABNORMAL LOW (ref 3.5–5.0)
Alkaline Phosphatase: 183 U/L — ABNORMAL HIGH (ref 38–126)
Anion gap: 8 (ref 5–15)
BUN: 15 mg/dL (ref 8–23)
CO2: 34 mmol/L — ABNORMAL HIGH (ref 22–32)
Calcium: 8.7 mg/dL — ABNORMAL LOW (ref 8.9–10.3)
Chloride: 95 mmol/L — ABNORMAL LOW (ref 98–111)
Creatinine, Ser: 0.51 mg/dL — ABNORMAL LOW (ref 0.61–1.24)
GFR calc Af Amer: >60 mL/min (ref >60)
GFR calc non Af Amer: >60 mL/min (ref >60)
Glucose, Bld: 91 mg/dL (ref 70–99)
Potassium: 3.3 mmol/L — ABNORMAL LOW (ref 3.5–5.1)
Sodium: 137 mmol/L (ref 135–145)
Total Bilirubin: 1.7 mg/dL — ABNORMAL HIGH (ref 0.3–1.2)
Total Protein: 5.4 g/dL — ABNORMAL LOW (ref 6.5–8.1)

## 2020-01-08 MED ORDER — ENSURE ENLIVE PO LIQD: 237.0000 mL | Freq: Three times a day (TID) | ORAL | 0 refills | Status: AC

## 2020-01-08 MED ORDER — POTASSIUM CHLORIDE CRYS ER 20 MEQ PO TBCR
20.0000 meq | EXTENDED_RELEASE_TABLET | Freq: Once | ORAL | Status: AC
  Administered 2020-01-08: 14:00:00 20 meq via ORAL
  Filled 2020-01-08: qty 1

## 2020-01-08 NOTE — Evaluation (Signed)
Clinical/Bedside Swallow Evaluation Patient Details  Name: Shawn Davis MRN: [id number] Date of Birth: [birth year]  Today's Date: 01/08/2020 Time: SLP Start Time (ACUTE ONLY): 1330 SLP Stop Time (ACUTE ONLY): 1402 SLP Time Calculation (min) (ACUTE ONLY): 32 min  Past Medical History:  Past Medical History:  Diagnosis Date  . Aspiration pneumonia (HCC)   . Aspiration, chronic pulmonary   . BPH (benign prostatic hyperplasia)   . COPD (chronic obstructive pulmonary disease) (HCC)   . HLD (hyperlipidemia)   . Protein calorie malnutrition (HCC)   . Pulmonary nodule, right    Past Surgical History:  Past Surgical History:  Procedure Laterality Date  . BILIARY BRUSHING  01/05/2020   Procedure: BILIARY BRUSHING;  Surgeon: Meridee Score Netty Starring., MD;  Location: [facility];  Service: Gastroenterology;;  . BILIARY DILATION  01/05/2020   Procedure: BILIARY DILATION;  Surgeon: Lemar Lofty., MD;  Location: [facility];  Service: Gastroenterology;;  . BILIARY STENT PLACEMENT N/A 01/05/2020   Procedure: BILIARY STENT PLACEMENT;  Surgeon: Lemar Lofty., MD;  Location: [facility];  Service: Gastroenterology;  Laterality: N/A;  . BIOPSY  01/05/2020   Procedure: BIOPSY;  Surgeon: Meridee Score Netty Starring., MD;  Location: [facility];  Service: Gastroenterology;;  . ERCP N/A 01/05/2020   Procedure: ENDOSCOPIC RETROGRADE CHOLANGIOPANCREATOGRAPHY (ERCP);  Surgeon: Lemar Lofty., MD;  Location: [facility];  Service: Gastroenterology;  Laterality: N/A;  . ESOPHAGOGASTRODUODENOSCOPY N/A 01/05/2020   Procedure: ESOPHAGOGASTRODUODENOSCOPY (EGD);  Surgeon: Lemar Lofty., MD;  Location: [facility];  Service: Gastroenterology;  Laterality: N/A;  . PANCREATIC STENT PLACEMENT  01/05/2020   Procedure: PANCREATIC STENT PLACEMENT;  Surgeon: Meridee Score Netty Starring., MD;  Location: [facility];  Service: Gastroenterology;;  . REMOVAL OF STONES  01/05/2020   Procedure:  REMOVAL OF STONES;  Surgeon: Lemar Lofty., MD;  Location: [facility];  Service: Gastroenterology;;  . Dennison Mascot  01/05/2020   Procedure: Dennison Mascot;  Surgeon: Lemar Lofty., MD;  Location: [facility];  Service: Gastroenterology;;   HPI:  84 y.o. male with medical history significant of steroid dependent COPD, h/o aspiration and aspiration pneumonia, BPH, protein calorie malnutrition depression who has had rapid weight loss and failure to thrive and sent to the ED at [facility] 01/01/20 where work-up showed CT scan showing intra-/extrahepatic duct dilatation diagnosing acute pancreatitis and transferred to [facility] long hospital 01/03/20.  At outside facility diagnosed as pancreatitis:MRCP  with intrahepatic biliary dilatation, questionable filling defects at the confluence of the right and left hepatic ducts, third spacing of fluid with trace ascites, mesenteric edema and subcutaneous edema.  Status post ERCP, biliary stricture brushed, sludge extracted and plastic stent placed in the pancreatic duct.  LFTs downtrending.LFTs downtrending.  Pt with PMH + for COPD, aspiration pna July 2021 with 14 pound weight loss since that hospital admit, dysphagia.  Per interview with daughter, it appears pt underwent MBS in hospital at [facility] in July but SLP unable to locate results. Daughter reports pt has been on puree/thin diet at home. RN reports pt coughing with intake today and concern for aspiration present, thus SLP swallow eval was ordered.  .   Assessment / Plan / Recommendation Clinical Impression  Patient with suspected multifactorial dysphagia as he reports symptoms of food lodging in throat and esophagus.  His CN exam was unremarkable except for generalized weakness.  Pt's breathing sounds congested at larynx, raising suspicion for secretion aspiration.  He attempted to cough and cough was productive x1 to viscous secretions.  SLP observed pt  with intake of  puree/thin - as family reports pt tolerates puree diet best.  Clinically pt appears with delay in swallow with multiple dry swallows across all consistencies *up to 4 with each bolus*.  Pt confirms sensation of pharyngeal retention. Delayed cough x1 noted at completion of small "snack" including several boluses of water and a single bolus of magic cup.  Encouraged pt to cough strongly and attempt to "hock" to clear.  Daughter informed SLP later *after eval* that pt had coughed up some frothy secretions - advised suspicion this may be esophageal source. Recommend stay upright after intake.    Pt reports he does not eat because he likes it, but because it's part of his schedule.  In addition, he becomes full quickly per family and pt reports he does not get hungry because he doesn't do anything to become hungry.    Reviewed option of MBS but advised that SLP does not suspect it would change pt's care plan, family agreeable to forgo MBS. Advised family and pt to suspicion of chronic aspiration for which pt tolerates at this time.   SLP goal is aspiration and dysphagia mitigation not resolution. Reviewed family report of pt having progressive weight loss and weakness and importance of maximizing nutrition.  Family desires pt to have a pureed diet as they report he tolerates it better.    SLP advised given pt to go to SNF, institutionalized feeding of puree/thin likely appropriate but encouraged them to bring soft solids or milkshakes, etc for pt to eat when they are with him.  Water neutrality making it safest to aspirate along with swallow precautions including starting intake with liquids *water, allowing time for pt to conduct dry swallows, following solids with liquids and staying upright after meals provided to family in writing and verbally.    All education completed to help mitigate dysphagia/aspiration. Recommend follow up at SNF as pt was working on respiratory muscle strength training to improve  swallow via HH.  Maximizing liquid nutrition, eg protein milkshakes, etc advised especially if pt tires of Ensure.    Thanks for this consult. SLP Visit Diagnosis: Dysphagia, oropharyngeal phase (R13.12);Dysphagia, pharyngoesophageal phase (R13.14)    Aspiration Risk  Moderate aspiration risk;Risk for inadequate nutrition/hydration    Diet Recommendation Dysphagia 1 (Puree);Thin liquid (puree per family wishes)   Medication Administration: Crushed with puree Supervision: Patient able to self feed Compensations: Slow rate;Small sips/bites;Other (Comment);Multiple dry swallows after each bite/sip;Follow solids with liquid Postural Changes: Seated upright at 90 degrees;Remain upright for at least 30 minutes after po intake    Other  Recommendations Oral Care Recommendations: Oral care BID   Follow up Recommendations Skilled Nursing facility      Frequency and Duration   n/a         Prognosis Prognosis for Safe Diet Advancement: Fair Barriers to Reach Goals: Time post onset      Swallow Study   General Date of Onset: 01/08/20 HPI: 84 y.o. male with medical history significant of steroid dependent COPD, h/o aspiration and aspiration pneumonia, BPH, protein calorie malnutrition depression who has had rapid weight loss and failure to thrive and sent to the ED at [facility] 01/01/20 where work-up showed CT scan showing intra-/extrahepatic duct dilatation diagnosing acute pancreatitis and transferred to [facility] long hospital 01/03/20.  At outside facility diagnosed as pancreatitis:MRCP  with intrahepatic biliary dilatation, questionable filling defects at the confluence of the right and left hepatic ducts, third spacing of fluid with trace ascites,  mesenteric edema and subcutaneous edema.  Status post ERCP, biliary stricture brushed, sludge extracted and plastic stent placed in the pancreatic duct.  LFTs downtrending.LFTs downtrending.  Pt with PMH + for COPD, aspiration pna  July 2021 with 14 pound weight loss since that hospital admit, dysphagia.  Per interview with daughter, it appears pt underwent MBS in hospital at [facility] in July but SLP unable to locate results. Daughter reports pt has been on puree/thin diet at home. RN reports pt coughing with intake today and concern for aspiration present, thus SLP swallow eval was ordered.  . Type of Study: Bedside Swallow Evaluation Diet Prior to this Study: Thin liquids (full liquids) Respiratory Status: Nasal cannula History of Recent Intubation: No Behavior/Cognition: Alert;Cooperative;Pleasant mood Oral Cavity Assessment: Within Functional Limits Oral Care Completed by SLP: No Oral Cavity - Dentition: Dentures, top;Missing dentition Vision: Functional for self-feeding Self-Feeding Abilities: Able to feed self Patient Positioning: Upright in bed Baseline Vocal Quality: Hoarse Volitional Cough: Strong Volitional Swallow: Unable to elicit    Oral/Motor/Sensory Function Overall Oral Motor/Sensory Function: Generalized oral weakness   Ice Chips Ice chips: Not tested   Thin Liquid Thin Liquid: Impaired Presentation: Straw;Self Fed Pharyngeal  Phase Impairments: Decreased hyoid-laryngeal movement;Multiple swallows;Cough - Delayed    Nectar Thick Nectar Thick Liquid: Not tested   Honey Thick Honey Thick Liquid: Not tested   Puree Puree: Impaired Presentation: Spoon Pharyngeal Phase Impairments: Multiple swallows;Decreased hyoid-laryngeal movement   Solid     Solid: Not tested      Chales Abrahams 01/08/2020,3:36 PM    Rolena Infante, MS [facility] SLP Acute Rehab Services Office [phone]

## 2020-01-08 NOTE — Progress Notes (Signed)
Physical Therapy Treatment Patient Details Name: Shawn Davis MRN: [id number] DOB: 27-Mar-1936 Today's Date: 01/08/2020    History of Present Illness 84 y.o. male with medical history significant of steroid dependent COPD, h/o aspiration and aspiration pneumonia, BPH, protein calorie malnutrition depression admitted to [facility] 8/14 for acute pancreatitis. Recent hx of failure to thrive and weight loss.    PT Comments    Pt in bed on 2 lts nasal sats at 98%.  General Comments: AxO x 3 pleasant and following all commands.  Daughter in room and very helpful.  Assisted OOB to amb.  General bed mobility comments: Mod assist to transfer to side of bed needing assistane for LEs and trunk lift off. Patient needed verbal and tactile cues to scoot to the edge of the bed.   Mod c/o dizziness BP EOB 118/59 HR 84 and sats on 2 lts was 97%  General transfer comment: assisted with sit to stand from elevated bed with 25% VC's on proper hand placement. General Gait Details: 25% VC's on proper walker to self distance and upright posture.  Limited distance due to fatigue and max weakness.  No energy. Pt plans to D/C to home with family support.   Follow Up Recommendations  Home health PT     Equipment Recommendations  None recommended by PT    Recommendations for Other Services       Precautions / Restrictions Precautions Precaution Comments: baseline 2-3L O2 Restrictions Weight Bearing Restrictions: No    Mobility  Bed Mobility Overal bed mobility: Needs Assistance Bed Mobility: Supine to Sit     Supine to sit: HOB elevated;Mod assist     General bed mobility comments: Mod assist to transfer to side of bed needing assistane for LEs and trunk lift off. Patient needed verbal and tactile cues to scoot to the edge of the bed.   Mod c/o dizziness BP EOB 118/59 HR 84 and sats on 2 lts was 97%  Transfers Overall transfer level: Needs assistance Equipment used: Rolling walker (2 wheeled) Transfers:  Sit to/from Stand Sit to Stand: Min assist         General transfer comment: assisted with sit to stand from elevated bed with 25% VC's on proper hand placement.  Ambulation/Gait Ambulation/Gait assistance: Min assist;Mod assist Gait Distance (Feet): 8 Feet Assistive device: Rolling walker (2 wheeled) Gait Pattern/deviations: Trunk flexed;Shuffle;Decreased stride length Gait velocity: decreased   General Gait Details: 25% VC's on proper walker to self distance and upright posture.  Limited distance due to fatigue and max weakness.  No energy.   Stairs             Wheelchair Mobility    Modified Rankin (Stroke Patients Only)       Balance                                            Cognition Arousal/Alertness: Awake/alert Behavior During Therapy: WFL for tasks assessed/performed Overall Cognitive Status: History of cognitive impairments - at baseline                                 General Comments: AxO x 3 pleasant and following all commands      Exercises      General Comments        Pertinent Vitals/Pain Pain  Assessment: No/denies pain    Home Living                      Prior Function            PT Goals (current goals can now be found in the care plan section) Progress towards PT goals: Progressing toward goals    Frequency    Min 3X/week      PT Plan Current plan remains appropriate    Co-evaluation              AM-PAC PT "6 Clicks" Mobility   Outcome Measure  Help needed turning from your back to your side while in a flat bed without using bedrails?: A Little Help needed moving from lying on your back to sitting on the side of a flat bed without using bedrails?: A Little Help needed moving to and from a bed to a chair (including a wheelchair)?: A Little Help needed standing up from a chair using your arms (e.g., wheelchair or bedside chair)?: A Little Help needed to walk in hospital  room?: A Little Help needed climbing 3-5 steps with a railing? : A Lot 6 Click Score: 17    End of Session Equipment Utilized During Treatment: Gait belt Activity Tolerance: Patient limited by fatigue Patient left: with call bell/phone within reach;in chair;with family/visitor present;with chair alarm set Nurse Communication: Mobility status PT Visit Diagnosis: Unsteadiness on feet (R26.81);Muscle weakness (generalized) (M62.81);Difficulty in walking, not elsewhere classified (R26.2)     Time: 1010-1035 PT Time Calculation (min) (ACUTE ONLY): 25 min  Charges:  $Gait Training: 8-22 mins $Therapeutic Activity: 8-22 mins                     Felecia Shelling  PTA Acute  Rehabilitation Services Pager      [phone] Office      [phone]

## 2020-01-08 NOTE — TOC Progression Note (Addendum)
Transition of Care [facility]) - Progression Note    Patient Details  Name: DENTON DERKS MRN: [id number] Date of Birth: 01-Jan-1936  Transition of Care [facility]) CM/SW Contact  Golda Acre, RN Phone Number: 01/08/2020, 10:26 AM  Clinical Narrative:    tct-Kathy at [facility] SN[facility] and rehab/pt is accepted and does have a bed today if needed.  Kathy's phone number is [phone] Message lsent to attending and rn concerning this and possible dc. P[atient has been dcd daughter will take in car.  TCT-Kathy Johnson at [facility] to get ok to send and to get report number for the RN. Daughter has personal o2 tanks for the ride to [place]. Expected Discharge Plan: Skilled Nursing Facility Barriers to Discharge: Continued Medical Work up  Expected Discharge Plan and Services Expected Discharge Plan: Skilled Nursing Facility   Discharge Planning Services: CM Consult Post Acute Care Choice: Home Health Living arrangements for the past 2 months: Single Family Home                 DME Arranged: N/A DME Agency: NA                   Social Determinants of Health (SDOH) Interventions    Readmission Risk Interventions No flowsheet data found.

## 2020-01-08 NOTE — Telephone Encounter (Signed)
Error no voice mail will try later

## 2020-01-08 NOTE — Telephone Encounter (Signed)
Left message on machine to call back  

## 2020-01-08 NOTE — Telephone Encounter (Signed)
Mansouraty, Netty Starring., MD  Loretha Stapler, RN Foy Mungia,  Please send letter when able.  The patient needs KUB in 2 weeks. He lives far away and [place], [place] so likely will need to have a local radiology study done at the hospital near their home (I believe is called [facility]).  Please reach out to the patient's daughter to help with arranging this.  An ERCP should be scheduled in the next 6 to 10 weeks and I would go ahead and get it on the books.  Thanks.  GM

## 2020-01-08 NOTE — Discharge Summary (Addendum)
Physician Discharge Summary  Shawn Davis BVQ:[id number] DOB: December 08, 1935 DOA: 01/03/2020  PCP: Marlise Eves, MD  Admit date: 01/03/2020   Discharge date:  01/11/2020  Admitted From:  Home. Disposition:  SNF with palliative care following. [facility].  Recommendations for Outpatient Follow-up:  1. Follow up with PCP in 1-2 weeks 2. Please obtain CMP/CBC in one week 3. Please follow up on the following pending results: cytology 4. Advised to take prednisone 40 mg daily for three more days to finish 5 days course. 5. Advised to continue Yupelri inhaler as prescribed.  Home Health:resume  Equipment/Devices: none  Discharge Condition: Stable Code Status:   Code Status: DNR Diet recommendation:  Diet Order            DIET - DYS 1 Room service appropriate? Yes; Fluid consistency: Thin  Diet effective now                Brief/Interim Summary: 84 y.o.malewith medical history significant ofsteroid dependent COPD, h/o aspiration and aspiration pneumonia, BPH, protein calorie malnutrition depression who has had rapid weight loss and failure to thrive and sent to the ED at [facility] 01/01/20 where work-up showed CT scan showing intra-/extrahepatic duct dilatation diagnosing acute pancreatitis and transferred to [facility] 01/03/20.  At outside facility diagnosed as pancreatitis: MRCP  with intrahepatic biliary dilatation, questionable filling defects at the confluence of the right and left hepatic ducts, third spacing of fluid with trace ascites, mesenteric edema and subcutaneous edema.  Status post ERCP, biliary stricture brushed, sludge extracted and plastic stent placed in the pancreatic duct.  LFTs downtrending. LFTs downtrending.  Follow-up with GI as outpatient. Patient was supposed to be discharged and then became lethargic, ABG showed Increased PCO2, placed on BIPAP, PCCM consulted, recommended to continue BIPAP as needed, prednisone for 5 days  and yupelri. Patient improved and back to baseline mental status, participated in physical therapy.  Patient is being discharged to skilled nursing facility for rehab.  He was managed for below problems.  Discharge Diagnoses:   Abnormal LFTs with dilated biliary tree secondary to biliary stricture: At outside facility diagnosed as pancreatitis:MRCP  with intrahepatic biliary dilatation, questionable filling defects at the confluence of the right and left hepatic ducts, third spacing of fluid with trace ascites, mesenteric edema and subcutaneous edema.  Status post ERCP, biliary stricture brushed, sludge extracted and plastic stent placed in the pancreatic duct.  LFTs improving, tolerating po. KUB in 10 to 14 days to assess pancreatic duct stent migration if retained will need repeat EGD to pull it out. Antibiotics discontinued.GI setting up outpatient appointment and will need KUB x-ray in 2 weeks and further plan as per GI.  Dysphagia:  patient had difficulty swallowing seen by speech and advised Pureed Diet upon discharge.  Patient has had MBS evaluation in [place]. He will continue follow-up with speech.  Failure to thrive/rapid weight loss:poor oral intake for few months, with chronic medical condition.  Continue to augment nutrition PT OT.  Followed by dietitian and speech at home  COPD/Chronic hypoxic respiratory failure on 2-3 l [place].per daughter "He has trouble breathing all the time" and has had recent hospitalization with subsequent decline,  failure to thrive: Continue inhaler.  Hypokalemia-replace.continue supplementation  BPH: Stable on Flomax and finasteride  Chronic pulmonary aspiration history followed by speech and nutrition at home.  Underweight severe protein calorie malnutrition- augment diet Consults: GI Subjective: Alert,  Awake,  no new complaints.  Speech worked  with the patient today daughter and wife at the bedside.  Discharge Exam: Vitals:   01/08/20 0743  01/08/20 0931  BP:  128/72  Pulse:  77  Resp:  16  Temp:  97.8 F (36.6 C)  SpO2: 98% 99%   General: Pt is alert, awake, not in acute distress Cardiovascular: RRR, S1/S2 +, no rubs, no gallops Respiratory: CTA bilaterally, no wheezing, no rhonchi Abdominal: Soft, NT, ND, bowel sounds + Extremities: no edema, no cyanosis  Discharge Instructions  Discharge Instructions    Discharge instructions   Complete by: As directed    Follow-up with GI for x-ray abdomen and possible removal of the stent if not migrated. Cmp in 1 wk Please call call MD or return to ER for similar or worsening recurring problem that brought you to hospital or if any fever,nausea/vomiting,abdominal pain, uncontrolled pain, chest pain,  shortness of breath or any other alarming symptoms.  Please follow-up your doctor as instructed in a week time and call the office for appointment.  Please avoid alcohol, smoking, or any other illicit substance and maintain healthy habits including taking your regular medications as prescribed.  You were cared for by a hospitalist during your hospital stay. If you have any questions about your discharge medications or the care you received while you were in the hospital after you are discharged, you can call the unit and ask to speak with the hospitalist on call if the hospitalist that took care of you is not available.  Once you are discharged, your primary care physician will handle any further medical issues. Please note that NO REFILLS for any discharge medications will be authorized once you are discharged, as it is imperative that you return to your primary care physician (or establish a relationship with a primary care physician if you do not have one) for your aftercare needs so that they can reassess your need for medications and monitor your lab values   Increase activity slowly   Complete by: As directed      Allergies as of 01/08/2020      Reactions    Budesonide-formoterol Fumarate Shortness Of Breath, Other (See Comments)   Chest pain, cough From external records; As of 01/03/20, pt states no allergies      Medication List    TAKE these medications   albuterol 108 (90 Base) MCG/ACT inhaler Commonly known as: VENTOLIN HFA Inhale 2 puffs into the lungs every 6 (six) hours as needed for shortness of breath or wheezing.   feeding supplement (ENSURE ENLIVE) Liqd Take 237 mLs by mouth 3 (three) times daily between meals.   finasteride 5 MG tablet Commonly known as: PROSCAR Take 5 mg by mouth daily.   FLUoxetine 40 MG capsule Commonly known as: PROZAC Take 40 mg by mouth daily.   Striverdi Respimat 2.5 MCG/ACT Aers Generic drug: Olodaterol HCl Inhale 2 puffs into the lungs daily.   tamsulosin 0.4 MG Caps capsule Commonly known as: FLOMAX Take 0.4 mg by mouth daily.       Follow-up Information    Mansouraty, Netty Starring., MD Follow up in 2 week(s).   Specialties: Gastroenterology, Internal Medicine Contact information: [address] [place] [address] [phone]        Marlise Eves, MD Follow up in 1 week(s).   Specialty: Family Medicine Contact information: [address] [place] [address] [phone]              Allergies  Allergen Reactions  .  Budesonide-Formoterol Fumarate Shortness Of Breath and Other (See Comments)    Chest pain, cough  From external records; As of 01/03/20, pt states no allergies       The results of significant diagnostics from this hospitalization (including imaging, microbiology, ancillary and laboratory) are listed below for reference.    Microbiology: Recent Results (from the past 240 hour(s))  SARS Coronavirus 2 by RT PCR (hospital order, performed in [facility] hospital lab) Nasopharyngeal Nasopharyngeal Swab     Status: None   Collection Time: 01/04/20 11:05 AM   Specimen: Nasopharyngeal Swab  Result Value Ref Range Status   SARS Coronavirus 2  NEGATIVE NEGATIVE Final    Comment: (NOTE) SARS-CoV-2 target nucleic acids are NOT DETECTED.  The SARS-CoV-2 RNA is generally detectable in upper and lower respiratory specimens during the acute phase of infection. The lowest concentration of SARS-CoV-2 viral copies this assay can detect is 250 copies / mL. A negative result does not preclude SARS-CoV-2 infection and should not be used as the sole basis for treatment or other patient management decisions.  A negative result may occur with improper specimen collection / handling, submission of specimen other than nasopharyngeal swab, presence of viral mutation(s) within the areas targeted by this assay, and inadequate number of viral copies (<250 copies / mL). A negative result must be combined with clinical observations, patient history, and epidemiological information.  Fact Sheet for Patients:   BoilerBrush.com.cy  Fact Sheet for Healthcare Providers: https://pope.com/  This test is not yet approved or  cleared by the [place] FDA and has been authorized for detection and/or diagnosis of SARS-CoV-2 by FDA under an Emergency Use Authorization (EUA).  This EUA will remain in effect (meaning this test can be used) for the duration of the COVID-19 declaration under Section 564(b)(1) of the Act, 21 U.S.C. section 360bbb-3(b)(1), unless the authorization is terminated or revoked sooner.  Performed at [facility], [address], [place], [place] [address]     Procedures/Studies: DG Abd 1 View  Result Date: 01/07/2020 CLINICAL DATA:  Pancreatic stent migration EXAM: ABDOMEN - 1 VIEW COMPARISON:  ERCP January 05, 2020 FINDINGS: There are stents in the mid abdomen predominantly to the right of midline. There is no bowel dilatation or air-fluid level to suggest bowel obstruction. No free air. Visualized lung bases are clear. There is aortic atherosclerosis.  IMPRESSION: Stents primarily to the right of midline in the mid abdominal region at the approximate L3-L4 level. Exact location of the stents cannot be ascertained on frontal radiographic view. It may be prudent to consider CT to more precisely assess stent location. No bowel obstruction or free air evident. Aortic Atherosclerosis (ICD10-I70.0). Electronically Signed   By: Bretta Bang III M.D.   On: 01/07/2020 08:16   MR 3D Recon At Scanner  Result Date: 01/05/2020 CLINICAL DATA:  Pancreatitis EXAM: MRI ABDOMEN WITHOUT AND WITH CONTRAST (INCLUDING MRCP) TECHNIQUE: Multiplanar multisequence MR imaging of the abdomen was performed both before and after the administration of intravenous contrast. Heavily T2-weighted images of the biliary and pancreatic ducts were obtained, and three-dimensional MRCP images were rendered by post processing. CONTRAST:  5mL GADAVIST GADOBUTROL 1 MMOL/ML IV SOLN COMPARISON:  None. FINDINGS: Despite efforts by the technologist and patient, motion artifact is present on today's exam and could not be eliminated. This reduces exam sensitivity and specificity. Lower chest: Subsegmental atelectasis in the posterior basal segments of both lower lobes. Hepatobiliary: Prominent intrahepatic biliary dilatation. In the confluence of the right  and left hepatic ducts, there is a suggestion of vague filling defects within the biliary tree, for example on image 20/6, without definite enhancement. This could represent sludge, blood products, tumor, stricture/infection, or flow related artifact. This is followed by a mildly dilated segment of the common hepatic duct at 1.1 cm, which tapers distally and in the common bile duct as shown on images 19 through 20 of series 6 in a conical fashion towards the ampulla. I do not see a distal filling defect in the common bile duct which is of more normal caliber. Aside from the clearly abnormal biliary tree, no specific individual parenchymal mass is  identified Pancreas: Normal caliber and course of the dorsal pancreatic duct. No findings of pancreatic pseudocyst or abscess. Subject to limitations by the motion artifact, no obvious pancreatic mass is identified. There is low-grade edema tracking along the upper abdominal mesentery but this is commensurate to the subcutaneous edema and appears more generalized, and accordingly we do not demonstrate specific imaging findings of acute pancreatitis. Spleen:  Unremarkable Adrenals/Urinary Tract: Right extrarenal pelvis without hydronephrosis. Adrenal glands unremarkable. Stomach/Bowel: Borderline prominence of colon caliber, constipation not excluded. No dilated small bowel in the visualized region. Vascular/Lymphatic: There is evidence of abdominal aortic atherosclerosis along with potentially substantial atherosclerotic narrowing of the proximal right common iliac artery. Other: Diffuse subcutaneous edema with generalized edema along the mesentery and tracking along the paraspinal musculature. Trace perihepatic and perisplenic ascites. Musculoskeletal: Mild dextroconvex scoliosis in the vicinity of the thoracolumbar junction. Lumbar spondylosis and degenerative disc disease. IMPRESSION: 1. Motion artifact substantially degrades images and adversely affects diagnostic accuracy. 2. Striking degree of intrahepatic biliary dilatation, questionable filling defect(s) at the confluence of the right and left hepatic ducts. Distal to this the common hepatic duct is only borderline dilated in the distal common bile duct demonstrates normal conical tapering course. No definite enhancing hepatic mass is identified. 3. No specific imaging findings of pancreatitis. 4. Third spacing of fluid with trace ascites, mesenteric edema, and subcutaneous edema. 5. Aortic atherosclerosis, potentially with substantial atherosclerotic narrowing of the proximal right common iliac artery. Electronically Signed   By: Gaylyn RongWalter  Liebkemann M.D.    On: 01/05/2020 09:37   DG ERCP BILIARY & PANCREATIC DUCTS  Result Date: 01/05/2020 CLINICAL DATA:  Pancreatitis, intrahepatic biliary ductal dilatation with concern of lesion at the biliary confluence on MRCP. EXAM: ERCP TECHNIQUE: Multiple spot images obtained with the fluoroscopic device and submitted for interpretation post-procedure. COMPARISON:  MRCP from earlier the same day FINDINGS: A series of fluoroscopic spot images document endoscopic cannulation and partial opacification of the pancreatic duct and CBD. Limited opacification of the intrahepatic biliary tree. Balloon catheter passage through the CBD. Plastic stent deployment in the pancreatic duct and CBD. IMPRESSION: 1. Endoscopic intervention as above, with pancreatic and biliary stent placement. These images were submitted for radiologic interpretation only. Please see the procedural report for the amount of contrast and the fluoroscopy time utilized. Electronically Signed   By: Corlis Leak  Hassell M.D.   On: 01/05/2020 16:31   MR ABDOMEN MRCP W WO CONTAST  Result Date: 01/05/2020 CLINICAL DATA:  Pancreatitis EXAM: MRI ABDOMEN WITHOUT AND WITH CONTRAST (INCLUDING MRCP) TECHNIQUE: Multiplanar multisequence MR imaging of the abdomen was performed both before and after the administration of intravenous contrast. Heavily T2-weighted images of the biliary and pancreatic ducts were obtained, and three-dimensional MRCP images were rendered by post processing. CONTRAST:  5mL GADAVIST GADOBUTROL 1 MMOL/ML IV SOLN COMPARISON:  None. FINDINGS: Despite efforts by  the technologist and patient, motion artifact is present on today's exam and could not be eliminated. This reduces exam sensitivity and specificity. Lower chest: Subsegmental atelectasis in the posterior basal segments of both lower lobes. Hepatobiliary: Prominent intrahepatic biliary dilatation. In the confluence of the right and left hepatic ducts, there is a suggestion of vague filling defects within  the biliary tree, for example on image 20/6, without definite enhancement. This could represent sludge, blood products, tumor, stricture/infection, or flow related artifact. This is followed by a mildly dilated segment of the common hepatic duct at 1.1 cm, which tapers distally and in the common bile duct as shown on images 19 through 20 of series 6 in a conical fashion towards the ampulla. I do not see a distal filling defect in the common bile duct which is of more normal caliber. Aside from the clearly abnormal biliary tree, no specific individual parenchymal mass is identified Pancreas: Normal caliber and course of the dorsal pancreatic duct. No findings of pancreatic pseudocyst or abscess. Subject to limitations by the motion artifact, no obvious pancreatic mass is identified. There is low-grade edema tracking along the upper abdominal mesentery but this is commensurate to the subcutaneous edema and appears more generalized, and accordingly we do not demonstrate specific imaging findings of acute pancreatitis. Spleen:  Unremarkable Adrenals/Urinary Tract: Right extrarenal pelvis without hydronephrosis. Adrenal glands unremarkable. Stomach/Bowel: Borderline prominence of colon caliber, constipation not excluded. No dilated small bowel in the visualized region. Vascular/Lymphatic: There is evidence of abdominal aortic atherosclerosis along with potentially substantial atherosclerotic narrowing of the proximal right common iliac artery. Other: Diffuse subcutaneous edema with generalized edema along the mesentery and tracking along the paraspinal musculature. Trace perihepatic and perisplenic ascites. Musculoskeletal: Mild dextroconvex scoliosis in the vicinity of the thoracolumbar junction. Lumbar spondylosis and degenerative disc disease. IMPRESSION: 1. Motion artifact substantially degrades images and adversely affects diagnostic accuracy. 2. Striking degree of intrahepatic biliary dilatation, questionable  filling defect(s) at the confluence of the right and left hepatic ducts. Distal to this the common hepatic duct is only borderline dilated in the distal common bile duct demonstrates normal conical tapering course. No definite enhancing hepatic mass is identified. 3. No specific imaging findings of pancreatitis. 4. Third spacing of fluid with trace ascites, mesenteric edema, and subcutaneous edema. 5. Aortic atherosclerosis, potentially with substantial atherosclerotic narrowing of the proximal right common iliac artery. Electronically Signed   By: Gaylyn Rong M.D.   On: 01/05/2020 09:37   CT OUTSIDE FILMS BODY/ABD/PELVIS  Result Date: 01/04/2020 This examination belongs to an outside facility and is stored here for comparison purposes only.  Contact the originating outside institution for any associated report or interpretation.   Labs: BNP (last 3 results) No results for input(s): BNP in the last 8760 hours. Basic Metabolic Panel: Recent Labs  Lab 01/03/20 2256 01/04/20 0815 01/05/20 0501 01/06/20 0512 01/08/20 0514  NA 137 137 136 135 137  K 5.0 4.6 3.7 4.0 3.3*  CL 99 97* 97* 98 95*  CO2 25 28 30 30  34*  GLUCOSE 94 131* 124* 92 91  BUN 28* 29* 22 17 15   CREATININE 0.83 0.83 0.63 0.61 0.51*  CALCIUM 9.4 9.0 8.9 8.7* 8.7*  MG  --  2.0  --   --   --    Liver Function Tests: Recent Labs  Lab 01/04/20 0815 01/05/20 0501 01/06/20 0512 01/07/20 0512 01/08/20 0514  AST 132* 121* 100* 86* 78*  ALT 293* 255* 241* 211* 184*  ALKPHOS 273*  223* 214* 198* 183*  BILITOT 2.4* 2.3* 1.5* 1.5* 1.7*  PROT 5.9* 5.3* 5.6* 5.5* 5.4*  ALBUMIN 3.5 3.2* 3.3* 3.3* 3.1*   Recent Labs  Lab 01/03/20 2256 01/04/20 0815  LIPASE 18 22  AMYLASE 48  --    No results for input(s): AMMONIA in the last 168 hours. CBC: Recent Labs  Lab 01/03/20 2256 01/04/20 0815 01/05/20 0501 01/06/20 0512 01/08/20 0514  WBC 9.6 8.8 5.0 5.1 7.5  NEUTROABS 8.8*  --   --   --   --   HGB 14.0 13.2  11.5* 11.7* 12.2*  HCT 44.7 41.5 35.1* 35.9* 37.7*  MCV 100.7* 101.2* 98.3 98.9 99.7  PLT 223 222 170 178 190   Cardiac Enzymes: No results for input(s): CKTOTAL, CKMB, CKMBINDEX, TROPONINI in the last 168 hours. BNP: Invalid input(s): POCBNP CBG: No results for input(s): GLUCAP in the last 168 hours. D-Dimer No results for input(s): DDIMER in the last 72 hours. Hgb A1c No results for input(s): HGBA1C in the last 72 hours. Lipid Profile No results for input(s): CHOL, HDL, LDLCALC, TRIG, CHOLHDL, LDLDIRECT in the last 72 hours. Thyroid function studies No results for input(s): TSH, T4TOTAL, T3FREE, THYROIDAB in the last 72 hours.  Invalid input(s): FREET3 Anemia work up No results for input(s): VITAMINB12, FOLATE, FERRITIN, TIBC, IRON, RETICCTPCT in the last 72 hours. Urinalysis No results found for: COLORURINE, APPEARANCEUR, LABSPEC, PHURINE, GLUCOSEU, HGBUR, BILIRUBINUR, KETONESUR, PROTEINUR, UROBILINOGEN, NITRITE, LEUKOCYTESUR Sepsis Labs Invalid input(s): PROCALCITONIN,  WBC,  LACTICIDVEN Microbiology Recent Results (from the past 240 hour(s))  SARS Coronavirus 2 by RT PCR (hospital order, performed in [facility] hospital lab) Nasopharyngeal Nasopharyngeal Swab     Status: None   Collection Time: 01/04/20 11:05 AM   Specimen: Nasopharyngeal Swab  Result Value Ref Range Status   SARS Coronavirus 2 NEGATIVE NEGATIVE Final    Comment: (NOTE) SARS-CoV-2 target nucleic acids are NOT DETECTED.  The SARS-CoV-2 RNA is generally detectable in upper and lower respiratory specimens during the acute phase of infection. The lowest concentration of SARS-CoV-2 viral copies this assay can detect is 250 copies / mL. A negative result does not preclude SARS-CoV-2 infection and should not be used as the sole basis for treatment or other patient management decisions.  A negative result may occur with improper specimen collection / handling, submission of specimen other than nasopharyngeal  swab, presence of viral mutation(s) within the areas targeted by this assay, and inadequate number of viral copies (<250 copies / mL). A negative result must be combined with clinical observations, patient history, and epidemiological information.  Fact Sheet for Patients:   BoilerBrush.com.cy  Fact Sheet for Healthcare Providers: https://pope.com/  This test is not yet approved or  cleared by the [place] FDA and has been authorized for detection and/or diagnosis of SARS-CoV-2 by FDA under an Emergency Use Authorization (EUA).  This EUA will remain in effect (meaning this test can be used) for the duration of the COVID-19 declaration under Section 564(b)(1) of the Act, 21 U.S.C. section 360bbb-3(b)(1), unless the authorization is terminated or revoked sooner.  Performed at [facility], [address] [place], [place] [address]      Time coordinating discharge: 25 minutes  SIGNED: Lanae Boast, MD  [facility] 01/08/2020, 2:17 PM  If 7PM-7AM, please contact night-coverage [email or web address]

## 2020-01-09 LAB — COMPREHENSIVE METABOLIC PANEL
ALT: 160 U/L — ABNORMAL HIGH (ref 0–44)
AST: 66 U/L — ABNORMAL HIGH (ref 15–41)
Albumin: 3.3 g/dL — ABNORMAL LOW (ref 3.5–5.0)
Alkaline Phosphatase: 212 U/L — ABNORMAL HIGH (ref 38–126)
Anion gap: 6 (ref 5–15)
BUN: 14 mg/dL (ref 8–23)
CO2: 39 mmol/L — ABNORMAL HIGH (ref 22–32)
Calcium: 8.9 mg/dL (ref 8.9–10.3)
Chloride: 95 mmol/L — ABNORMAL LOW (ref 98–111)
Creatinine, Ser: 0.54 mg/dL — ABNORMAL LOW (ref 0.61–1.24)
GFR calc Af Amer: >60 mL/min (ref >60)
GFR calc non Af Amer: >60 mL/min (ref >60)
Glucose, Bld: 122 mg/dL — ABNORMAL HIGH (ref 70–99)
Potassium: 4.1 mmol/L (ref 3.5–5.1)
Sodium: 140 mmol/L (ref 135–145)
Total Bilirubin: 1.3 mg/dL — ABNORMAL HIGH (ref 0.3–1.2)
Total Protein: 6 g/dL — ABNORMAL LOW (ref 6.5–8.1)

## 2020-01-09 LAB — BLOOD GAS, ARTERIAL
Acid-Base Excess: 11 mmol/L — ABNORMAL HIGH (ref 0.0–2.0)
Acid-Base Excess: 13 mmol/L — ABNORMAL HIGH (ref 0.0–2.0)
Bicarbonate: 38.8 mmol/L — ABNORMAL HIGH (ref 20.0–28.0)
Bicarbonate: 39.2 mmol/L — ABNORMAL HIGH (ref 20.0–28.0)
O2 Saturation: 97.7 %
O2 Saturation: 98.3 %
Patient temperature: 98.6
Patient temperature: 98.6
pCO2 arterial: 70.7 mmHg (ref 32.0–48.0)
pCO2 arterial: 58.7 mmHg — ABNORMAL HIGH (ref 32.0–48.0)
pH, Arterial: 7.359 (ref 7.350–7.450)
pH, Arterial: 7.44 (ref 7.350–7.450)
pO2, Arterial: 105 mmHg (ref 83.0–108.0)
pO2, Arterial: 94.9 mmHg (ref 83.0–108.0)

## 2020-01-09 MED ORDER — REVEFENACIN 175 MCG/3ML IN SOLN
175.0000 ug | Freq: Every day | RESPIRATORY_TRACT | Status: DC
  Administered 2020-01-11: 175 ug via RESPIRATORY_TRACT
  Filled 2020-01-09 (×2): qty 3

## 2020-01-09 MED ORDER — ADULT MULTIVITAMIN LIQUID CH
15.0000 mL | Freq: Every day | ORAL | Status: DC
  Administered 2020-01-10 – 2020-01-11 (×2): 15 mL via ORAL
  Filled 2020-01-09 (×2): qty 15

## 2020-01-09 MED ORDER — FOLIC ACID 1 MG PO TABS
1.0000 mg | ORAL_TABLET | Freq: Every day | ORAL | Status: DC
  Administered 2020-01-09 – 2020-01-11 (×3): 1 mg via ORAL
  Filled 2020-01-09 (×3): qty 1

## 2020-01-09 MED ORDER — PREDNISONE 20 MG PO TABS
40.0000 mg | ORAL_TABLET | Freq: Every day | ORAL | Status: DC
  Administered 2020-01-10 – 2020-01-11 (×2): 40 mg via ORAL
  Filled 2020-01-09 (×2): qty 2

## 2020-01-09 MED ORDER — THIAMINE HCL 100 MG PO TABS
100.0000 mg | ORAL_TABLET | Freq: Every day | ORAL | Status: DC
  Administered 2020-01-09 – 2020-01-11 (×3): 100 mg via ORAL
  Filled 2020-01-09 (×3): qty 1

## 2020-01-09 NOTE — Progress Notes (Signed)
PROGRESS NOTE    Shawn Davis  ZOX:[id number] DOB: 07/15/1935 DOA: 01/03/2020 PCP: Marlise EvesButler, Amy E, MD    Brief Narrative: 84 y.o. male with medical history significant of steroid dependent COPD, h/o aspiration and aspiration pneumonia, BPH, protein calorie malnutrition depression who has had rapid weight loss and failure to thrive and sent to the ED at [facility] 01/01/20 where work-up showed CT scan showing intra-/extrahepatic duct dilatation diagnosing acute pancreatitis and [facility].  At outside facility diagnosed as pancreatitis:MRCP  with intrahepatic biliary dilatation, questionable filling defects at the confluence of the right and left hepatic ducts, third spacing of fluid with trace ascites, mesenteric edema and subcutaneous edema.  Status post ERCP, biliary stricture brushed, sludge extracted and plastic stent placed in the pancreatic duct.  LFTs downtrending.   Subjective: Facility unable to accept him during the evening yesterday so dc held. However this morning patient was difficult to arouse despite to sternal rub, later on was slowly waking up, ABG was done that showed hypercapnia with PCO2 in 70s, I reviewed his ABG from December 19, 2019 his hospital discharge and PCO2 was 58 His serum bicarb on admission at 25 and today at 39   Assessment & Plan:  Acute on chronic hypercapnic and hypoxic respiratory failure-baseline PCO2 likely in high 50s. This morning difficult to arouse ABG shows PCO2 70s-discussed with family/daughteragreed for BiPAP trial-Fu ABG obtained and pco2 better at 58 and ph 7.44.He takes shallows breath in sleep and TV low in 200s but when awake TV improves well. I discussed with PCCM who will consult him- cont BIPAP/o2 management per PCCM.  Difficult to arouse from hypercapnea with acute metabolic  encephalopathy-will do BiPAP trial as above. Somewhat more alert after bipap.  Abnormal LFTs with dilated biliary  tree secondary to biliary stricture: At outside facility diagnosed as pancreatitis:MRCP  with intrahepatic biliary dilatation, questionable filling defects at the confluence of the right and left hepatic ducts, third spacing of fluid with trace ascites, mesenteric edema and subcutaneous edema. Status post ERCP, biliary stricture brushed, sludge extracted and plastic stent placed in the pancreatic duct.  Patient was seen by GI and signed off, he finished IV antibiotics 24 hours post ERCP.  lfts improving, follow-up with GI for KUB in 2 weeks to ensure plastic stent has passed   Dysphagia patient had difficulty swallowing seen by speech -per family request placed on PUREED.Continue the diet, continue aspiration precaution  with speech   Failure to thrive/rapid weight loss severe protein calorie malnutritin: Oral intake marginal continue on Augmentin his nutritional status, appreciate dietitian input, is being followed by speech and dietitian at home.  Overall prognosis is guarded.    COPD/Chronic hypoxic respiratory failure on 2-3 l [place].per daughter "He has trouble breathing all the time" and has had recent hospitalization with subsequent decline failure to thrive: Continue inhaler.  This morning BiPAP trial due to his altered mental status metabolic encephalopathy   Hypokalemia-replace.continue supplementation   BPH: Stable on Flomax and finasteride   Chronic pulmonary aspiration history followed by speech and nutrition at home.   Underweight severe protein calorie malnutrition- augment diet  GOC:DNR, Overall prognosis is guarded with his FTT/Severe protein calorie malnutrition. Will benefit with palliative care eval.  DVT prophylaxis:  Code Status:   Code Status: DNR  Family Communication: plan of care discussed with patient's wife and daughter at bedside. Status is: Inpatient  Remains inpatient appropriate because:Inpatient level of care appropriate due to severity  of illness and For  management of his confusion difficulty arousing hypercapnic respiratory failure  Dispo: The patient is from: Home              Anticipated d/c is to: SNF              Anticipated d/c date is: TBD              Patient currently is not medically stable to d/c. He is moved to progressive unit, monitor closely.  Nutrition: Diet Order            DIET - DYS 1 Room service appropriate? Yes; Fluid consistency: Thin  Diet effective now                 Nutrition Problem: Severe Malnutrition Etiology: chronic illness (COPD) Signs/Symptoms: severe fat depletion, severe muscle depletion, energy intake < or equal to 75% for > or equal to 1 month, percent weight loss Interventions: Refer to RD note for recommendations Body mass index is 15.07 kg/m.  Consultants:see note  Procedures:see note Microbiology:see note Blood Culture No results found for: SDES, SPECREQUEST, CULT, REPTSTATUS  Other culture-see note  Medications: Scheduled Meds: . arformoterol  15 mcg Nebulization BID  . feeding supplement (ENSURE ENLIVE)  237 mL Oral TID BM  . finasteride  5 mg Oral Daily  . FLUoxetine  40 mg Oral Daily  . senna  1 tablet Oral BID  . tamsulosin  0.4 mg Oral Daily   Continuous Infusions:  Antimicrobials: Anti-infectives (From admission, onward)   Start     Dose/Rate Route Frequency Ordered Stop   01/04/20 0000  piperacillin-tazobactam (ZOSYN) IVPB 3.375 g  Status:  Discontinued        3.375 g 100 mL/hr over 30 Minutes Intravenous Every 6 hours 01/03/20 2230 01/03/20 2235   01/03/20 2245  piperacillin-tazobactam (ZOSYN) IVPB 3.375 g        3.375 g 12.5 mL/hr over 240 Minutes Intravenous Every 8 hours 01/03/20 2235 01/06/20 2359       Objective: Vitals: Today's Vitals   01/09/20 1102 01/09/20 1326 01/09/20 1514 01/09/20 1724  BP: 111/60 104/64 136/66   Pulse: 62 (!) 57 (!) 47   Resp: 14 17 20    Temp: 97.6 F (36.4 C) 97.7 F (36.5 C) 98.5 F (36.9 C)   TempSrc: Oral Axillary  Oral   SpO2: 99% 98% 100%   Weight:      Height:      PainSc:    0-No pain    Intake/Output Summary (Last 24 hours) at 01/09/2020 1744 Last data filed at 01/09/2020 1330 Gross per 24 hour  Intake 357 ml  Output 400 ml  Net -43 ml   Filed Weights   01/05/20 1236  Weight: 47.6 kg   Weight change:    Intake/Output from previous day: 08/19 0701 - 08/20 0700 In: 477 [P.O.:477] Out: 400 [Urine:400] Intake/Output this shift: Total I/O In: 120 [P.O.:120] Out: 0   Examination:  General exam: Sleepy difficult to arouse, weak frail, on nasal cannula oxygen in am  HEENT:Oral mucosa moist, Ear/Nose WNL grossly,dentition normal. Respiratory system: bilaterally diminished, no use of accessory muscle, non tender. Cardiovascular system: S1 & S2 +,regular, No JVD. Gastrointestinal system: Abdomen soft, NT,ND, BS+. Nervous System: Able to move extremities-nonfocal, slow to respond. Extremities: No edema, distal peripheral pulses palpable.  Skin: No rashes,no icterus. MSK: Normal muscle bulk,tone, power.  Data Reviewed: I have personally reviewed following labs and imaging studies CBC: Recent Labs  Lab 01/03/20 2256 01/04/20 0815 01/05/20 0501 01/06/20 0512 01/08/20 0514  WBC 9.6 8.8 5.0 5.1 7.5  NEUTROABS 8.8*  --   --   --   --   HGB 14.0 13.2 11.5* 11.7* 12.2*  HCT 44.7 41.5 35.1* 35.9* 37.7*  MCV 100.7* 101.2* 98.3 98.9 99.7  PLT 223 222 170 178 190   Basic Metabolic Panel: Recent Labs  Lab 01/04/20 0815 01/05/20 0501 01/06/20 0512 01/08/20 0514 01/09/20 0958  NA 137 136 135 137 140  K 4.6 3.7 4.0 3.3* 4.1  CL 97* 97* 98 95* 95*  CO2 28 30 30  34* 39*  GLUCOSE 131* 124* 92 91 122*  BUN 29* 22 17 15 14   CREATININE 0.83 0.63 0.61 0.51* 0.54*  CALCIUM 9.0 8.9 8.7* 8.7* 8.9  MG 2.0  --   --   --   --    GFR: Estimated Creatinine Clearance: 47.9 mL/min (A) (by C-G formula based on SCr of 0.54 mg/dL (L)). Liver Function Tests: Recent Labs  Lab 01/05/20 0501  01/06/20 0512 01/07/20 0512 01/08/20 0514 01/09/20 0958  AST 121* 100* 86* 78* 66*  ALT 255* 241* 211* 184* 160*  ALKPHOS 223* 214* 198* 183* 212*  BILITOT 2.3* 1.5* 1.5* 1.7* 1.3*  PROT 5.3* 5.6* 5.5* 5.4* 6.0*  ALBUMIN 3.2* 3.3* 3.3* 3.1* 3.3*   Recent Labs  Lab 01/03/20 2256 01/04/20 0815  LIPASE 18 22  AMYLASE 48  --    No results for input(s): AMMONIA in the last 168 hours. Coagulation Profile: No results for input(s): INR, PROTIME in the last 168 hours. Cardiac Enzymes: No results for input(s): CKTOTAL, CKMB, CKMBINDEX, TROPONINI in the last 168 hours. BNP (last 3 results) No results for input(s): PROBNP in the last 8760 hours. HbA1C: No results for input(s): HGBA1C in the last 72 hours. CBG: No results for input(s): GLUCAP in the last 168 hours. Lipid Profile: No results for input(s): CHOL, HDL, LDLCALC, TRIG, CHOLHDL, LDLDIRECT in the last 72 hours. Thyroid Function Tests: No results for input(s): TSH, T4TOTAL, FREET4, T3FREE, THYROIDAB in the last 72 hours. Anemia Panel: No results for input(s): VITAMINB12, FOLATE, FERRITIN, TIBC, IRON, RETICCTPCT in the last 72 hours. Sepsis Labs: No results for input(s): PROCALCITON, LATICACIDVEN in the last 168 hours.  Recent Results (from the past 240 hour(s))  SARS Coronavirus 2 by RT PCR (hospital order, performed in [facility] hospital lab) Nasopharyngeal Nasopharyngeal Swab     Status: None   Collection Time: 01/04/20 11:05 AM   Specimen: Nasopharyngeal Swab  Result Value Ref Range Status   SARS Coronavirus 2 NEGATIVE NEGATIVE Final    Comment: (NOTE) SARS-CoV-2 target nucleic acids are NOT DETECTED.  The SARS-CoV-2 RNA is generally detectable in upper and lower respiratory specimens during the acute phase of infection. The lowest concentration of SARS-CoV-2 viral copies this assay can detect is 250 copies / mL. A negative result does not preclude SARS-CoV-2 infection and should not be used as the sole basis for  treatment or other patient management decisions.  A negative result may occur with improper specimen collection / handling, submission of specimen other than nasopharyngeal swab, presence of viral mutation(s) within the areas targeted by this assay, and inadequate number of viral copies (<250 copies / mL). A negative result must be combined with clinical observations, patient history, and epidemiological information.  Fact Sheet for Patients:   [facility]  Fact Sheet for Healthcare Providers: 01/06/20  This test is not yet approved or  cleared by  the [employer] and has been authorized for detection and/or diagnosis of SARS-CoV-2 by FDA under an Emergency Use Authorization (EUA).  This EUA will remain in effect (meaning this test can be used) for the duration of the COVID-19 declaration under Section 564(b)(1) of the Act, 21 U.S.C. section 360bbb-3(b)(1), unless the authorization is terminated or revoked sooner.  Performed at [facility], [address], [place], [place] [address]       Radiology Studies: No results found.   LOS: 6 days   Lanae Boast, MD [facility]  01/09/2020, 5:44 PM

## 2020-01-09 NOTE — Telephone Encounter (Signed)
I spoke with the pt's daughter and wife.  They gave me a number to call send KUB order to. Also the pt has been advised we will call in a few weeks to set up ERCP>

## 2020-01-09 NOTE — Progress Notes (Signed)
Writer went into patient's room. Patient hard to arouse. Vital signs obtained. Charge nurse came into the room tried to arouse patient. Patient responded and open his eye & went right back to sleep. Dr. Early Chars paged regarding patient's condition. MD came and saw patient. New orders

## 2020-01-09 NOTE — TOC Progression Note (Deleted)
Transition of Care [facility]) - Progression Note    Patient Details  Name: Shawn Davis MRN: [id number] Date of Birth: 12-Apr-1936  [facility] CM/SW Contact  Golda Acre, RN Phone Number: 01/09/2020, 1:15 PM  Clinical Narrative:    TCT Talmadge Chad at [facility] at [phone].  Patient is unable to come today may be able to on Saturday.  w   Expected Discharge Plan: Skilled Nursing Facility Barriers to Discharge: Continued Medical Work up  Expected Discharge Plan and Services Expected Discharge Plan: Skilled Nursing Facility   Discharge Planning Services: CM Consult Post Acute Care Choice: Home Health Living arrangements for the past 2 months: Single Family Home Expected Discharge Date: 01/07/20               DME Arranged: N/A DME Agency: NA                   Social Determinants of Health (SDOH) Interventions    Readmission Risk Interventions No flowsheet data found.

## 2020-01-09 NOTE — TOC Progression Note (Signed)
Transition of Care [facility]) - Progression Note    Patient Details  Name: ELPIDIO THIELEN MRN: [id number] Date of Birth: 1935-08-09  Transition [facility]) CM/SW Contact  Golda Acre, RN Phone Number: 01/09/2020, 1:25 PM  Clinical Narrative:    tct Talmadge Chad at [facility] rehab and snf.  Patient can come on Saturday or Sunday just have someone call.   Expected Discharge Plan: Skilled Nursing Facility Barriers to Discharge: Continued Medical Work up  Expected Discharge Plan and Services Expected Discharge Plan: Skilled Nursing Facility   Discharge Planning Services: CM Consult Post Acute Care Choice: Home Health Living arrangements for the past 2 months: Single Family Home Expected Discharge Date: 01/07/20               DME Arranged: N/A DME Agency: NA                   Social Determinants of Health (SDOH) Interventions    Readmission Risk Interventions No flowsheet data found.

## 2020-01-09 NOTE — Progress Notes (Signed)
Patient removed from BiPAP at this time. Patient more alert and hungry. MD wishes to use continue BiPAP QHS and redraw ABG in the am. RT will continue to monitor patient.

## 2020-01-09 NOTE — Progress Notes (Signed)
Patient started on BiPAP per MD order. Patient is currently wake and able to have a conversation. Tolerating BiPAP at this time. Awaiting progressive bed.

## 2020-01-09 NOTE — Consult Note (Signed)
NAME:  Shawn Davis, MRN:  [id number], DOB:  27-May-1935, LOS: 6 ADMISSION DATE:  01/03/2020, CONSULTATION DATE:  01/09/20 REFERRING MD:  Jonathon Bellows, CHIEF COMPLAINT:  FTT   Brief History     History of present illness   84 year old man with hx of advanced COPD on HOT, prior aspiration presenting with FTT, rapid weight loss found to have biliary sludge causing pancreatitis.  Underwent ERCP and pancreatic stent placement.Family states he has been eating less over last several months, stating he is not hungry.  Hospital course complicated by increased somnolence not improved much by BIPAP so PCCM consulted.  Followed by [facility].  Per review of their notes he has refused pulmonary consultation.  Home pulmonary meds include olodaterol, duonebs, and 5mg  prednisone.  Spirometry 2014 shows FEV1 0.6L (28% pred).  Past Medical History  copd FTT Weight loss HLD BPH  Significant Hospital Events   N/A  Consults:  GI Palliative  Procedures:  ERCP 8/16  Significant Diagnostic Tests:  MRCP 8/16 IMPRESSION: 1. Motion artifact substantially degrades images and adversely affects diagnostic accuracy. 2. Striking degree of intrahepatic biliary dilatation, questionable filling defect(s) at the confluence of the right and left hepatic ducts. Distal to this the common hepatic duct is only borderline dilated in the distal common bile duct demonstrates normal conical tapering course. No definite enhancing hepatic mass is identified. 3. No specific imaging findings of pancreatitis. 4. Third spacing of fluid with trace ascites, mesenteric edema, and subcutaneous edema. 5. Aortic atherosclerosis, potentially with substantial atherosclerotic narrowing of the proximal right common iliac artery.  Micro Data:  COVID neg  Antimicrobials:  none   Interim history/subjective:  consulted  Objective   Blood pressure 136/66, pulse (!) 47, temperature 98.5 F (36.9 C), temperature  source Oral, resp. rate 20, height 5\' 10"  (1.778 m), weight 47.6 kg, SpO2 100 %.        Intake/Output Summary (Last 24 hours) at 01/09/2020 1753 Last data filed at 01/09/2020 1330 Gross per 24 hour  Intake 357 ml  Output 400 ml  Net -43 ml   Filed Weights   01/05/20 1236  Weight: 47.6 kg    Examination: General: frail cachetic elderly man on BIPAP HENT: BIPAP in place with good seal pulling TV 700, no accessory muscle use Lungs: severely reduced bilaterally, no accessory muscle use Cardiovascular: RRR, ext warm Abdomen: soft, +BS Extremities: muscle wasting Neuro: moves all 4 ext to command, RASS -1 Skin: multiple areas of skin breakdown  Resolved Hospital Problem list   N/A  Assessment & Plan:  - Failure to thrive in elderly - Severe protein calorie malnutrition (53kg Jan, now 47kg) - Advanced COPD on HOT and chronic steroids - Chronic hypercarbic respiratory failure - Some component of pulmonary cachexia - Reportedly hx of aspiration pneumonia which could explain food aversion; on dysphagia diet - Obstructive pancreatitis post stenting improving labs - AMS, question raised of CO2 retention but he is compensated - Mild cognitive impairment per family  Discussed situation with wife and daughter that if he cannot gain weight he will not live much longer.  Hopefully fixing the pancreatitis will fix some element of this.  Would encourage boost, start MVNs, thiamine folate.  Regarding his somnolence, would check ammonia given acute liver injury.  Somnolence not explained by ABG.  He does wake up for me, follow commands, and has good strength.  The BIPAP may help over the course of several weeks with his CO2 retention, this can  help reduce admissions related to COPD.  I think low risk high reward to treat mild COPD flare with course of steroids.  Add yupelri to his brovana.  We did discuss a PEG briefly as this would fix a lot of his nutritional issues but I think having  palliative sit with patient/family and really decide how much Ryle wants to push this.  Overall my recommendations (and orders are) - Prednisone 40mg  daily x 5 days - Yupelri - BIPAP qHS and PRN - Would not recheck ABG unless he does not wake up after 10 mins of BIPAP - Palliative care consultation which has already been ordered - As patient is DNR there's not really much more I can offer here, please reach out to the pulmonary service Monday if there are any further questions or concerns   Labs   CBC: Recent Labs  Lab 01/03/20 2256 01/04/20 0815 01/05/20 0501 01/06/20 0512 01/08/20 0514  WBC 9.6 8.8 5.0 5.1 7.5  NEUTROABS 8.8*  --   --   --   --   HGB 14.0 13.2 11.5* 11.7* 12.2*  HCT 44.7 41.5 35.1* 35.9* 37.7*  MCV 100.7* 101.2* 98.3 98.9 99.7  PLT 223 222 170 178 190    Basic Metabolic Panel: Recent Labs  Lab 01/04/20 0815 01/05/20 0501 01/06/20 0512 01/08/20 0514 01/09/20 0958  NA 137 136 135 137 140  K 4.6 3.7 4.0 3.3* 4.1  CL 97* 97* 98 95* 95*  CO2 28 30 30  34* 39*  GLUCOSE 131* 124* 92 91 122*  BUN 29* 22 17 15 14   CREATININE 0.83 0.63 0.61 0.51* 0.54*  CALCIUM 9.0 8.9 8.7* 8.7* 8.9  MG 2.0  --   --   --   --    GFR: Estimated Creatinine Clearance: 47.9 mL/min (A) (by C-G formula based on SCr of 0.54 mg/dL (L)). Recent Labs  Lab 01/04/20 0815 01/05/20 0501 01/06/20 0512 01/08/20 0514  WBC 8.8 5.0 5.1 7.5    Liver Function Tests: Recent Labs  Lab 01/05/20 0501 01/06/20 0512 01/07/20 0512 01/08/20 0514 01/09/20 0958  AST 121* 100* 86* 78* 66*  ALT 255* 241* 211* 184* 160*  ALKPHOS 223* 214* 198* 183* 212*  BILITOT 2.3* 1.5* 1.5* 1.7* 1.3*  PROT 5.3* 5.6* 5.5* 5.4* 6.0*  ALBUMIN 3.2* 3.3* 3.3* 3.1* 3.3*   Recent Labs  Lab 01/03/20 2256 01/04/20 0815  LIPASE 18 22  AMYLASE 48  --    No results for input(s): AMMONIA in the last 168 hours.  ABG    Component Value Date/Time   PHART 7.440 01/09/2020 1639   PCO2ART 58.7 (H)  01/09/2020 1639   PO2ART 94.9 01/09/2020 1639   HCO3 39.2 (H) 01/09/2020 1639   O2SAT 98.3 01/09/2020 1639     Coagulation Profile: No results for input(s): INR, PROTIME in the last 168 hours.  Cardiac Enzymes: No results for input(s): CKTOTAL, CKMB, CKMBINDEX, TROPONINI in the last 168 hours.  HbA1C: No results found for: HGBA1C  CBG: No results for input(s): GLUCAP in the last 168 hours.  Review of Systems:    Positive Symptoms in bold:  Constitutional fevers, chills, weight loss, fatigue, anorexia, malaise  Eyes decreased vision, double vision, eye irritation  Ears, Nose, Mouth, Throat sore throat, trouble swallowing, sinus congestion  Cardiovascular chest pain, paroxysmal nocturnal dyspnea, lower ext edema, palpitations   Respiratory SOB, cough, DOE, hemoptysis, wheezing  Gastrointestinal nausea, vomiting, diarrhea  Genitourinary burning with urination, trouble urinating  Musculoskeletal joint aches, joint  swelling, back pain  Integumentary  rashes, skin lesions  Neurological focal weakness, focal numbness, trouble speaking, headaches  Psychiatric depression, anxiety, confusion  Endocrine polyuria, polydipsia, cold intolerance, heat intolerance  Hematologic abnormal bruising, abnormal bleeding, unexplained nose bleeds  Allergic/Immunologic recurrent infections, hives, swollen lymph nodes     Past Medical History  He,  has a past medical history of Aspiration pneumonia (HCC), Aspiration, chronic pulmonary, BPH (benign prostatic hyperplasia), COPD (chronic obstructive pulmonary disease) (HCC), HLD (hyperlipidemia), Protein calorie malnutrition (HCC), and Pulmonary nodule, right.   Surgical History    Past Surgical History:  Procedure Laterality Date   BILIARY BRUSHING  01/05/2020   Procedure: BILIARY BRUSHING;  Surgeon: Meridee Score Netty Starring., MD;  Location: [facility];  Service: Gastroenterology;;   BILIARY DILATION  01/05/2020   Procedure: BILIARY DILATION;   Surgeon: Lemar Lofty., MD;  Location: [facility];  Service: Gastroenterology;;   BILIARY STENT PLACEMENT N/A 01/05/2020   Procedure: BILIARY STENT PLACEMENT;  Surgeon: Lemar Lofty., MD;  Location: [facility];  Service: Gastroenterology;  Laterality: N/A;   BIOPSY  01/05/2020   Procedure: BIOPSY;  Surgeon: Meridee Score Netty Starring., MD;  Location: [facility];  Service: Gastroenterology;;   ERCP N/A 01/05/2020   Procedure: ENDOSCOPIC RETROGRADE CHOLANGIOPANCREATOGRAPHY (ERCP);  Surgeon: Lemar Lofty., MD;  Location: [facility];  Service: Gastroenterology;  Laterality: N/A;   ESOPHAGOGASTRODUODENOSCOPY N/A 01/05/2020   Procedure: ESOPHAGOGASTRODUODENOSCOPY (EGD);  Surgeon: Lemar Lofty., MD;  Location: [facility];  Service: Gastroenterology;  Laterality: N/A;   PANCREATIC STENT PLACEMENT  01/05/2020   Procedure: PANCREATIC STENT PLACEMENT;  Surgeon: Meridee Score Netty Starring., MD;  Location: [facility];  Service: Gastroenterology;;   REMOVAL OF STONES  01/05/2020   Procedure: REMOVAL OF STONES;  Surgeon: Lemar Lofty., MD;  Location: [facility];  Service: Gastroenterology;;   Dennison Mascot  01/05/2020   Procedure: Dennison Mascot;  Surgeon: Mansouraty, Netty Starring., MD;  Location: [facility];  Service: Gastroenterology;;     Social History   reports that he has quit smoking. He has never used smokeless tobacco. He reports previous alcohol use. He reports that he does not use drugs.   Family History   His family history includes CAD in his brother and mother; Lung cancer in his father; Pancreatic cancer in his mother.   Allergies Allergies  Allergen Reactions   Budesonide-Formoterol Fumarate Shortness Of Breath and Other (See Comments)    Chest pain, cough  From external records; As of 01/03/20, pt states no allergies        Home Medications  Prior to Admission medications   Medication Sig Start Date End Date Taking?  Authorizing Provider  albuterol (VENTOLIN HFA) 108 (90 Base) MCG/ACT inhaler Inhale 2 puffs into the lungs every 6 (six) hours as needed for shortness of breath or wheezing. 12/08/19  Yes [provider]  finasteride (PROSCAR) 5 MG tablet Take 5 mg by mouth daily. 12/08/19  Yes [provider]  FLUoxetine (PROZAC) 40 MG capsule Take 40 mg by mouth daily. 12/08/19  Yes [provider]  STRIVERDI RESPIMAT 2.5 MCG/ACT AERS Inhale 2 puffs into the lungs daily. 12/08/19  Yes [provider]  tamsulosin (FLOMAX) 0.4 MG CAPS capsule Take 0.4 mg by mouth daily. 12/08/19  Yes [provider]  feeding supplement, ENSURE ENLIVE, (ENSURE ENLIVE) LIQD Take 237 mLs by mouth 3 (three) times daily between meals. 01/08/20   Lanae Boast, MD

## 2020-01-09 NOTE — Progress Notes (Signed)
Spoke with MD regarding ABG results. Patient had been sleepy and difficult to arouse. At time of ABG, patient began to arouse more and became more interactive. BiPAP discussed with MD and potential of patient needing transfer to progressive care floor. MD to speak to family; no new orders at this time.

## 2020-01-09 NOTE — Progress Notes (Signed)
Patient continues to tolerate BiPAP at this time, however, IPAP increased due to low Vt of 200's and low Ve - <4. IPAP increased to 12 (12/5) and Vt now 400's with increase in Ve.

## 2020-01-10 DIAGNOSIS — R0602 Shortness of breath: Secondary | ICD-10-CM

## 2020-01-10 DIAGNOSIS — Z7189 Other specified counseling: Secondary | ICD-10-CM

## 2020-01-10 DIAGNOSIS — Z515 Encounter for palliative care: Secondary | ICD-10-CM

## 2020-01-10 DIAGNOSIS — R531 Weakness: Secondary | ICD-10-CM

## 2020-01-10 DIAGNOSIS — E43 Unspecified severe protein-calorie malnutrition: Secondary | ICD-10-CM

## 2020-01-10 LAB — COMPREHENSIVE METABOLIC PANEL
ALT: 133 U/L — ABNORMAL HIGH (ref 0–44)
AST: 57 U/L — ABNORMAL HIGH (ref 15–41)
Albumin: 3.1 g/dL — ABNORMAL LOW (ref 3.5–5.0)
Alkaline Phosphatase: 206 U/L — ABNORMAL HIGH (ref 38–126)
Anion gap: 5 (ref 5–15)
BUN: 16 mg/dL (ref 8–23)
CO2: 40 mmol/L — ABNORMAL HIGH (ref 22–32)
Calcium: 9.1 mg/dL (ref 8.9–10.3)
Chloride: 95 mmol/L — ABNORMAL LOW (ref 98–111)
Creatinine, Ser: 0.55 mg/dL — ABNORMAL LOW (ref 0.61–1.24)
GFR calc Af Amer: >60 mL/min (ref >60)
GFR calc non Af Amer: >60 mL/min (ref >60)
Glucose, Bld: 103 mg/dL — ABNORMAL HIGH (ref 70–99)
Potassium: 4.6 mmol/L (ref 3.5–5.1)
Sodium: 140 mmol/L (ref 135–145)
Total Bilirubin: 1 mg/dL (ref 0.3–1.2)
Total Protein: 5.6 g/dL — ABNORMAL LOW (ref 6.5–8.1)

## 2020-01-10 LAB — AMMONIA: Ammonia: 19 umol/L (ref 9–35)

## 2020-01-10 LAB — CBC
HCT: 35.5 % — ABNORMAL LOW (ref 39.0–52.0)
Hemoglobin: 11.4 g/dL — ABNORMAL LOW (ref 13.0–17.0)
MCH: 32.5 pg (ref 26.0–34.0)
MCHC: 32.1 g/dL (ref 30.0–36.0)
MCV: 101.1 fL — ABNORMAL HIGH (ref 80.0–100.0)
Platelets: 202 10*3/uL (ref 150–400)
RBC: 3.51 MIL/uL — ABNORMAL LOW (ref 4.22–5.81)
RDW: 14.4 % (ref 11.5–15.5)
WBC: 7.9 10*3/uL (ref 4.0–10.5)
nRBC: 0 % (ref 0.0–0.2)

## 2020-01-10 NOTE — Consult Note (Signed)
Consultation Note Date: 01/10/2020   Patient Name: Shawn Davis  DOB: 08-Dec-1935  MRN: [id number]  Age / Sex: 84 y.o., male  PCP: Marlise Eves, MD Referring Physician: Cipriano Bunker, MD  Reason for Consultation: Establishing goals of care  HPI/Patient Profile: 84 y.o. male  admitted on 01/03/2020   Shawn Davis is 84 years old.  He lives at home in [place], [place] with his wife and daughter.  Patient has a past medical history significant for advanced chronic obstructive pulmonary disease he is on home oxygen therapy, also has past medical history of dyslipidemia and BPH.  Patient has been admitted to the hospital with failure to thrive weight loss and was found to have biliary sludge causing pancreatitis.  He was seen and evaluated by GI, underwent ERCP and had a pancreatic stent placed.  Patient has been seen by pulmonary specialist for his advanced COPD with recommendations for BiPAP and his COPD chronic management regimen has been augmented.  Patient has been followed by speech therapy and is on pured diet.  A palliative consultation has been requested for broad goals of care discussions.  Clinical Assessment and Goals of Care: Patient is awake alert resting in bed.  He appears with generalized weakness. His wife and daughter are also present at the bedside.   I introduced myself and palliative care as follows: Palliative medicine is specialized medical care for people living with serious illness. It focuses on providing relief from the symptoms and stress of a serious illness. The goal is to improve quality of life for both the patient and the family.  Goals of care: Broad aims of medical therapy in relation to the patient's values and preferences. Our aim is to provide medical care aimed at enabling patients to achieve the goals that matter most to them, given the circumstances of their particular  medical situation and their constraints.   We discussed about the patient's current medical condition requiring hospitalization as well as his underlying conditions.  Discussed about advanced chronic obstructive pulmonary disease and essentially that it is an irreversible terminal illness.  We discussed about aspiration etiology in great detail.  We discussed about his current GI issues and next steps in plan of care.  Concepts specific to artificial nutrition and hydration were also discussed.  Risks benefits of artificial nutrition via PEG tube were discussed in detail.  Discussed that a PEG tube would not prevent aspiration, would be an avenue for receiving artificial nutrition, has side effects of causing skin breakdown, possible recurrent aspiration, possible abdominal emergency if the tube becomes dislodged.  Pulmonary cachexia was also discussed, chronic end-stage lung disease as well as patient's current GI issues have been causing failure to thrive.  We discussed extensively about current nutrition, prioritizing multiple small meals throughout the day, nutritional supplements.  NEXT OF KIN Wife daughter  SUMMARY OF RECOMMENDATIONS   We discussed again about DO NOT RESUSCITATE.  Patient is wearing a purple bracelet.  At end-of-life, patient does not want heroic measures. Continue current mode  of care, chart review notes that the patient is to go to [facility] skilled nursing facility.  If possible, recommend palliative services following over there. Very briefly, towards the end of the initial consultation, we discussed about after current trial of therapies, after current efforts to improve and maintain patient's nutritional status, if the patient has ongoing decline or decompensation, at that time, it would be appropriate to consider hospice.  Brief description of hospice philosophy of care was also given.  At this time, patient family wishes to and is hopeful for some degree of  stabilization and recovery. Thank you for the consult. Code Status/Advance Care Planning:  DNR    Symptom Management:    as above.   Palliative Prophylaxis:   Delirium Protocol  Psycho-social/Spiritual:   Desire for further Chaplaincy support:yes  Additional Recommendations: Caregiving  Support/Resources  Prognosis:   Unable to determine  Discharge Planning: Skilled Nursing Facility for rehab with Palliative care service follow-up      Primary Diagnoses: Present on Admission: **None**   I have reviewed the medical record, interviewed the patient and family, and examined the patient. The following aspects are pertinent.  Past Medical History:  Diagnosis Date  . Aspiration pneumonia (HCC)   . Aspiration, chronic pulmonary   . BPH (benign prostatic hyperplasia)   . COPD (chronic obstructive pulmonary disease) (HCC)   . HLD (hyperlipidemia)   . Protein calorie malnutrition (HCC)   . Pulmonary nodule, right    Social History   Socioeconomic History  . Marital status: Married    Spouse name: Not on file  . Number of children: Not on file  . Years of education: Not on file  . Highest education level: Not on file  Occupational History  . Not on file  Tobacco Use  . Smoking status: Former Games developer  . Smokeless tobacco: Never Used  Vaping Use  . Vaping Use: Never used  Substance and Sexual Activity  . Alcohol use: Not Currently  . Drug use: Never  . Sexual activity: Not on file  Other Topics Concern  . Not on file  Social History Narrative  . Not on file   Social Determinants of Health   Financial Resource Strain:   . Difficulty of Paying Living Expenses: Not on file  Food Insecurity:   . Worried About Programme researcher, broadcasting/film/video in the Last Year: Not on file  . Ran Out of Food in the Last Year: Not on file  Transportation Needs:   . Lack of Transportation (Medical): Not on file  . Lack of Transportation (Non-Medical): Not on file  Physical Activity:   .  Days of Exercise per Week: Not on file  . Minutes of Exercise per Session: Not on file  Stress:   . Feeling of Stress : Not on file  Social Connections:   . Frequency of Communication with Friends and Family: Not on file  . Frequency of Social Gatherings with Friends and Family: Not on file  . Attends Religious Services: Not on file  . Active Member of Clubs or Organizations: Not on file  . Attends Banker Meetings: Not on file  . Marital Status: Not on file   Family History  Problem Relation Age of Onset  . Pancreatic cancer Mother   . CAD Mother   . Lung cancer Father   . CAD Brother    Scheduled Meds: . arformoterol  15 mcg Nebulization BID  . feeding supplement (ENSURE ENLIVE)  237 mL  Oral TID BM  . finasteride  5 mg Oral Daily  . FLUoxetine  40 mg Oral Daily  . folic acid  1 mg Oral Daily  . multivitamin  15 mL Oral Daily  . predniSONE  40 mg Oral Q breakfast  . revefenacin  175 mcg Nebulization Daily  . senna  1 tablet Oral BID  . tamsulosin  0.4 mg Oral Daily  . thiamine  100 mg Oral Daily   Continuous Infusions: PRN Meds:.acetaminophen **OR** acetaminophen, albuterol Medications Prior to Admission:  Prior to Admission medications   Medication Sig Start Date End Date Taking? Authorizing Provider  albuterol (VENTOLIN HFA) 108 (90 Base) MCG/ACT inhaler Inhale 2 puffs into the lungs every 6 (six) hours as needed for shortness of breath or wheezing. 12/08/19  Yes [provider]  finasteride (PROSCAR) 5 MG tablet Take 5 mg by mouth daily. 12/08/19  Yes [provider]  FLUoxetine (PROZAC) 40 MG capsule Take 40 mg by mouth daily. 12/08/19  Yes [provider]  STRIVERDI RESPIMAT 2.5 MCG/ACT AERS Inhale 2 puffs into the lungs daily. 12/08/19  Yes [provider]  tamsulosin (FLOMAX) 0.4 MG CAPS capsule Take 0.4 mg by mouth daily. 12/08/19  Yes [provider]  feeding supplement, ENSURE ENLIVE, (ENSURE ENLIVE) LIQD Take  237 mLs by mouth 3 (three) times daily between meals. 01/08/20   Lanae Boast, MD   Allergies  Allergen Reactions  . Budesonide-Formoterol Fumarate Shortness Of Breath and Other (See Comments)    Chest pain, cough  From external records; As of 01/03/20, pt states no allergies      Review of Systems General  weakness Physical Exam Awake alert Did eat most of his breakfast Diminished breath sounds S1 S2 Has some muscle wasting Abdomen is not distended No edema Non focal  Vital Signs: BP 140/83 (BP Location: Right Arm)   Pulse 68   Temp 97.8 F (36.6 C) (Oral)   Resp 18   Ht 5\' 10"  (1.778 m)   Wt 47.6 kg   SpO2 100%   BMI 15.07 kg/m  Pain Scale: 0-10   Pain Score: 0-No pain   SpO2: SpO2: 100 % O2 Device:SpO2: 100 % O2 Flow Rate: .O2 Flow Rate (L/min): 2 L/min  IO: Intake/output summary:   Intake/Output Summary (Last 24 hours) at 01/10/2020 1231 Last data filed at 01/10/2020 0532 Gross per 24 hour  Intake 120 ml  Output 220 ml  Net -100 ml    LBM: Last BM Date: 01/09/20 Baseline Weight: Weight: 47.6 kg Most recent weight: Weight: 47.6 kg     Palliative Assessment/Data:   PPS 40%  Time In:  11 Time Out:  12 Time Total:   60 min.  Greater than 50%  of this time was spent counseling and coordinating care related to the above assessment and plan.  Signed by: 01/11/20, MD   Please contact Palliative Medicine Team phone at [phone] for questions and concerns.  For individual provider: See [phone]

## 2020-01-10 NOTE — Progress Notes (Signed)
Entered room to check Bipap and pt. Found off machine and back on Graball, which is sitting in his mouth.  Pt. States, "he likes it there".  Will continue to monitor.

## 2020-01-10 NOTE — Progress Notes (Signed)
Pt. Without distress and states that he does not feel like he needs to wear Bipap at this time.  Will be available if needs or wants change.

## 2020-01-10 NOTE — Progress Notes (Addendum)
PROGRESS NOTE    Shawn Davis  ZOX:[id number] DOB: 08-29-1935 DOA: 01/03/2020 PCP: Marlise Eves, MD   Brief Narrative:  84 y.o.malewith medical history significant ofsteroid dependent COPD, h/o aspiration and aspiration pneumonia, BPH, protein calorie malnutrition,  depression who has had rapid weight loss and failure to thrive and sent to the ED at [facility] 01/01/20 where work-up showed CT scan showing intra-/extrahepatic duct dilatation diagnosing acute pancreatitis and then transferred to [facility] 01/03/20. Atoutside facility diagnosed as pancreatitis: MRCP withintrahepatic biliary dilatation, questionable filling defects at the confluence of the right and left hepatic ducts, third spacing of fluid with trace ascites, mesenteric edema and subcutaneous edema. Status post ERCP, biliary stricture brushed, sludge extracted and plastic stent placed in the pancreatic duct.LFTs downtrending.  Patient became lethargic yesterday retaining PCO2, much improved today.    Assessment & Plan:   Active Problems:   Pancreatitis, acute   Steroid-dependent COPD (HCC)   BPH (benign prostatic hyperplasia)   Chronic pulmonary aspiration   Protein-calorie malnutrition, severe   Acute on chronic hypercapnic and hypoxic respiratory failure- Baseline PCO2 likely in high 50s. Yesterday morning, difficult to arouse ABG shows PCO2 70s- discussed with family /daughter  agreed for BiPAP trial-Fu ABG obtained and pCO2 better at 58 and ph 7.44. He takes shallows breath in sleep and TV low in 200s but when awake TV improves well. It was discussed with PCCM who will consult him- cont BIPAP/o2 management per PCCM.  Difficult to arouse from hypercapnea with acute metabolic encephalopathy-will do BiPAP trial as above. Somewhat more alert after bipap.  Abnormal LFTs with dilated biliary treesecondary to biliary stricture: Atoutside facility diagnosed as pancreatitis:MRCP  withintrahepatic biliary dilatation, questionable filling defects at the confluence of the right and left hepatic ducts, third spacing of fluid with trace ascites, mesenteric edema and subcutaneous edema. Status post ERCP, biliary stricture brushed, sludge extracted and plastic stent placed in the pancreatic duct.  Patient was seen by GI and signed off, he finished IV antibiotics 24 hours post ERCP.  lfts improving, follow-up with GI for KUB in 2 weeks to ensure plastic stent has passed.  Dysphagia:  Patient had difficulty swallowing seen by speech -per family request placed on PUREED. Continue the diet, continue aspiration precaution  with speech  Failure to thrive/rapid weight loss severe protein calorie malnutritin: Oral intake marginal,  continue on Augmented diet, his nutritional status, appreciate dietitian input, is being followed by speech and dietitian at home.  Overall prognosis is guarded.   COPD/Chronic hypoxic respiratory failure on 2-3 l [place].per daughter "He has trouble breathing all the time" and has had recent hospitalization with subsequent decline failure to thrive: Continue inhaler.  This morning BiPAP trial due to his altered mental status metabolic encephalopathy  Hypokalemia-replace.continue supplementation  WJX:BJYNWG on Flomax and finasteride  Chronic pulmonary aspiration history followed by speech and nutrition at home.  Underweight severe protein calorie malnutrition- augment diet  GOC:DNR, Overall prognosis is guarded with his FTT/Severe protein calorie malnutrition. Will benefit with palliative care eval.    DVT prophylaxis: SCDs Code Status: DNR/DNI Family Communication: plan of care discussed with patient's wife and daughter at bedside. Disposition Plan: Palliative care consulted to discuss goals of care.  Dispo: The patient is from: Home  Anticipated d/c is to: SNF  Anticipated d/c date is: TBD  Patient  currently is not medically stable to d/c. He is moved to progressive unit, monitor closely.   Consultants:  Gastroenterology  Procedures:  ERCP Antimicrobials: Anti-infectives (From admission, onward)   Start     Dose/Rate Route Frequency Ordered Stop   01/04/20 0000  piperacillin-tazobactam (ZOSYN) IVPB 3.375 g  Status:  Discontinued        3.375 g 100 mL/hr over 30 Minutes Intravenous Every 6 hours 01/03/20 2230 01/03/20 2235   01/03/20 2245  piperacillin-tazobactam (ZOSYN) IVPB 3.375 g        3.375 g 12.5 mL/hr over 240 Minutes Intravenous Every 8 hours 01/03/20 2235 01/06/20 2359      Subjective: Patient was seen and examined at bedside.  He is more alert,  awake following commands.  Daughter and wife was at bedside. He said he is much improved than he was yesterday.  Objective: Vitals:   01/10/20 0005 01/10/20 0516 01/10/20 0810 01/10/20 1318  BP: 122/75 140/83  109/63  Pulse: 77 68  67  Resp: 17 18  18   Temp: 98.2 F (36.8 C) 97.8 F (36.6 C)  97.7 F (36.5 C)  TempSrc: Oral Oral  Oral  SpO2: 100% 100% 100% 100%  Weight:      Height:        Intake/Output Summary (Last 24 hours) at 01/10/2020 1345 Last data filed at 01/10/2020 0532 Gross per 24 hour  Intake --  Output 220 ml  Net -220 ml   Filed Weights   01/05/20 1236  Weight: 47.6 kg    Examination:  General exam: Appears calm and comfortable  Respiratory system: Clear to auscultation. Respiratory effort normal. Cardiovascular system: S1 & S2 heard, RRR. No JVD, murmurs, rubs, gallops or clicks. No pedal edema. Gastrointestinal system: Abdomen is nondistended, soft and nontender. No organomegaly or masses felt. Normal bowel sounds heard. Central nervous system: Alert and oriented. No focal neurological deficits. Extremities: Symmetric 5 x 5 power. Skin: No rashes, lesions or ulcers Psychiatry: Judgement and insight appear normal. Mood & affect appropriate.     Data Reviewed: I have personally  reviewed following labs and imaging studies  CBC: Recent Labs  Lab 01/03/20 2256 01/03/20 2256 01/04/20 0815 01/05/20 0501 01/06/20 0512 01/08/20 0514 01/10/20 0614  WBC 9.6   < > 8.8 5.0 5.1 7.5 7.9  NEUTROABS 8.8*  --   --   --   --   --   --   HGB 14.0   < > 13.2 11.5* 11.7* 12.2* 11.4*  HCT 44.7   < > 41.5 35.1* 35.9* 37.7* 35.5*  MCV 100.7*   < > 101.2* 98.3 98.9 99.7 101.1*  PLT 223   < > 222 170 178 190 202   < > = values in this interval not displayed.   Basic Metabolic Panel: Recent Labs  Lab 01/04/20 0815 01/04/20 0815 01/05/20 0501 01/06/20 0512 01/08/20 0514 01/09/20 0958 01/10/20 0614  NA 137   < > 136 135 137 140 140  K 4.6   < > 3.7 4.0 3.3* 4.1 4.6  CL 97*   < > 97* 98 95* 95* 95*  CO2 28   < > 30 30 34* 39* 40*  GLUCOSE 131*   < > 124* 92 91 122* 103*  BUN 29*   < > 22 17 15 14 16   CREATININE 0.83   < > 0.63 0.61 0.51* 0.54* 0.55*  CALCIUM 9.0   < > 8.9 8.7* 8.7* 8.9 9.1  MG 2.0  --   --   --   --   --   --    < > = values in  this interval not displayed.   GFR: Estimated Creatinine Clearance: 47.9 mL/min (A) (by C-G formula based on SCr of 0.55 mg/dL (L)). Liver Function Tests: Recent Labs  Lab 01/06/20 0512 01/07/20 0512 01/08/20 0514 01/09/20 0958 01/10/20 0614  AST 100* 86* 78* 66* 57*  ALT 241* 211* 184* 160* 133*  ALKPHOS 214* 198* 183* 212* 206*  BILITOT 1.5* 1.5* 1.7* 1.3* 1.0  PROT 5.6* 5.5* 5.4* 6.0* 5.6*  ALBUMIN 3.3* 3.3* 3.1* 3.3* 3.1*   Recent Labs  Lab 01/03/20 2256 01/04/20 0815  LIPASE 18 22  AMYLASE 48  --    Recent Labs  Lab 01/10/20 0614  AMMONIA 19   Coagulation Profile: No results for input(s): INR, PROTIME in the last 168 hours. Cardiac Enzymes: No results for input(s): CKTOTAL, CKMB, CKMBINDEX, TROPONINI in the last 168 hours. BNP (last 3 results) No results for input(s): PROBNP in the last 8760 hours. HbA1C: No results for input(s): HGBA1C in the last 72 hours. CBG: No results for input(s): GLUCAP  in the last 168 hours. Lipid Profile: No results for input(s): CHOL, HDL, LDLCALC, TRIG, CHOLHDL, LDLDIRECT in the last 72 hours. Thyroid Function Tests: No results for input(s): TSH, T4TOTAL, FREET4, T3FREE, THYROIDAB in the last 72 hours. Anemia Panel: No results for input(s): VITAMINB12, FOLATE, FERRITIN, TIBC, IRON, RETICCTPCT in the last 72 hours. Sepsis Labs: No results for input(s): PROCALCITON, LATICACIDVEN in the last 168 hours.  Recent Results (from the past 240 hour(s))  SARS Coronavirus 2 by RT PCR (hospital order, performed in [facility] hospital lab) Nasopharyngeal Nasopharyngeal Swab     Status: None   Collection Time: 01/04/20 11:05 AM   Specimen: Nasopharyngeal Swab  Result Value Ref Range Status   SARS Coronavirus 2 NEGATIVE NEGATIVE Final    Comment: (NOTE) SARS-CoV-2 target nucleic acids are NOT DETECTED.  The SARS-CoV-2 RNA is generally detectable in upper and lower respiratory specimens during the acute phase of infection. The lowest concentration of SARS-CoV-2 viral copies this assay can detect is 250 copies / mL. A negative result does not preclude SARS-CoV-2 infection and should not be used as the sole basis for treatment or other patient management decisions.  A negative result may occur with improper specimen collection / handling, submission of specimen other than nasopharyngeal swab, presence of viral mutation(s) within the areas targeted by this assay, and inadequate number of viral copies (<250 copies / mL). A negative result must be combined with clinical observations, patient history, and epidemiological information.  Fact Sheet for Patients:   BoilerBrush.com.cy  Fact Sheet for Healthcare Providers: https://pope.com/  This test is not yet approved or  cleared by the [place] FDA and has been authorized for detection and/or diagnosis of SARS-CoV-2 by FDA under an Emergency Use Authorization  (EUA).  This EUA will remain in effect (meaning this test can be used) for the duration of the COVID-19 declaration under Section 564(b)(1) of the Act, 21 U.S.C. section 360bbb-3(b)(1), unless the authorization is terminated or revoked sooner.  Performed at [facility], [address] [place], [place] [address]      Radiology Studies: No results found.  Scheduled Meds: . arformoterol  15 mcg Nebulization BID  . feeding supplement (ENSURE ENLIVE)  237 mL Oral TID BM  . finasteride  5 mg Oral Daily  . FLUoxetine  40 mg Oral Daily  . folic acid  1 mg Oral Daily  . multivitamin  15 mL Oral Daily  . predniSONE  40 mg Oral  Q breakfast  . revefenacin  175 mcg Nebulization Daily  . senna  1 tablet Oral BID  . tamsulosin  0.4 mg Oral Daily  . thiamine  100 mg Oral Daily   Continuous Infusions:   LOS: 7 days    Time spent: 25 mins.    Cipriano BunkerPARDEEP Calil Amor, MD [facility]   If 7PM-7AM, please contact night-coverage

## 2020-01-11 DIAGNOSIS — J449 Chronic obstructive pulmonary disease, unspecified: Secondary | ICD-10-CM

## 2020-01-11 LAB — COMPREHENSIVE METABOLIC PANEL
ALT: 109 U/L — ABNORMAL HIGH (ref 0–44)
AST: 43 U/L — ABNORMAL HIGH (ref 15–41)
Albumin: 3.1 g/dL — ABNORMAL LOW (ref 3.5–5.0)
Alkaline Phosphatase: 196 U/L — ABNORMAL HIGH (ref 38–126)
Anion gap: 7 (ref 5–15)
BUN: 21 mg/dL (ref 8–23)
CO2: 43 mmol/L — ABNORMAL HIGH (ref 22–32)
Calcium: 9.7 mg/dL (ref 8.9–10.3)
Chloride: 94 mmol/L — ABNORMAL LOW (ref 98–111)
Creatinine, Ser: 0.59 mg/dL — ABNORMAL LOW (ref 0.61–1.24)
GFR calc Af Amer: >60 mL/min (ref >60)
GFR calc non Af Amer: >60 mL/min (ref >60)
Glucose, Bld: 105 mg/dL — ABNORMAL HIGH (ref 70–99)
Potassium: 4.9 mmol/L (ref 3.5–5.1)
Sodium: 144 mmol/L (ref 135–145)
Total Bilirubin: 0.9 mg/dL (ref 0.3–1.2)
Total Protein: 5.6 g/dL — ABNORMAL LOW (ref 6.5–8.1)

## 2020-01-11 LAB — CBC
HCT: 37 % — ABNORMAL LOW (ref 39.0–52.0)
Hemoglobin: 11.6 g/dL — ABNORMAL LOW (ref 13.0–17.0)
MCH: 32.4 pg (ref 26.0–34.0)
MCHC: 31.4 g/dL (ref 30.0–36.0)
MCV: 103.4 fL — ABNORMAL HIGH (ref 80.0–100.0)
Platelets: 227 10*3/uL (ref 150–400)
RBC: 3.58 MIL/uL — ABNORMAL LOW (ref 4.22–5.81)
RDW: 14.3 % (ref 11.5–15.5)
WBC: 7.8 10*3/uL (ref 4.0–10.5)
nRBC: 0 % (ref 0.0–0.2)

## 2020-01-11 LAB — PHOSPHORUS: Phosphorus: 2.8 mg/dL (ref 2.5–4.6)

## 2020-01-11 LAB — MAGNESIUM: Magnesium: 2.1 mg/dL (ref 1.7–2.4)

## 2020-01-11 LAB — SARS CORONAVIRUS 2 (TAT 6-24 HRS): SARS Coronavirus 2: NEGATIVE

## 2020-01-11 MED ORDER — FOLIC ACID 1 MG PO TABS: 1.0000 mg | ORAL_TABLET | Freq: Every day | ORAL | 0 refills | Status: AC

## 2020-01-11 MED ORDER — PREDNISONE 20 MG PO TABS: 40.0000 mg | ORAL_TABLET | Freq: Every day | ORAL | 0 refills | Status: AC

## 2020-01-11 MED ORDER — REVEFENACIN 175 MCG/3ML IN SOLN: 175.0000 ug | Freq: Every day | RESPIRATORY_TRACT | 0 refills | Status: AC

## 2020-01-11 MED ORDER — THIAMINE HCL 100 MG PO TABS: 100.0000 mg | ORAL_TABLET | Freq: Every day | ORAL | 0 refills | Status: AC

## 2020-01-11 NOTE — Progress Notes (Signed)
Physical Therapy Treatment Patient Details Name: Shawn Davis MRN: [id number] DOB: April 17, 1936 Today's Date: 01/11/2020    History of Present Illness 84 y.o. male with medical history significant of steroid dependent COPD, h/o aspiration and aspiration pneumonia, BPH, protein calorie malnutrition depression admitted to [facility] 8/14 for acute pancreatitis. Recent hx of failure to thrive and weight loss.    PT Comments    Pt in good spirits and very agreeable.  Pt up to standing with assist for extensive perineal hygiene following bowel incontinence and then to walk increased distance in hall with RW and assist.  Pt ltd by fatigue and c/o dizziness - SaO2 98% and BP 149/65 - RN aware.   Follow Up Recommendations  Home health PT     Equipment Recommendations  None recommended by PT    Recommendations for Other Services       Precautions / Restrictions Precautions Precautions: Fall Precaution Comments: baseline 2-3L O2 Restrictions Weight Bearing Restrictions: No    Mobility  Bed Mobility Overal bed mobility: Needs Assistance Bed Mobility: Supine to Sit     Supine to sit: HOB elevated;Min guard        Transfers Overall transfer level: Needs assistance Equipment used: Rolling walker (2 wheeled) Transfers: Sit to/from Stand Sit to Stand: Min assist         General transfer comment: assisted with sit to stand from elevated bed with 25% VC's on proper hand placement.  Ambulation/Gait Ambulation/Gait assistance: Min assist Gait Distance (Feet): 28 Feet (and additional 12') Assistive device: Rolling walker (2 wheeled) Gait Pattern/deviations: Trunk flexed;Shuffle;Decreased stride length Gait velocity: decreased   General Gait Details: cues for posture and position from RW.  Distance ltd by fatigue   Stairs             Wheelchair Mobility    Modified Rankin (Stroke Patients Only)       Balance Overall balance assessment: Needs assistance Sitting-balance  support: Feet supported Sitting balance-Leahy Scale: Good     Standing balance support: Bilateral upper extremity supported;During functional activity Standing balance-Leahy Scale: Poor                              Cognition Arousal/Alertness: Awake/alert Behavior During Therapy: WFL for tasks assessed/performed Overall Cognitive Status: History of cognitive impairments - at baseline                                 General Comments: AxO x 3 pleasant and following all commands      Exercises      General Comments        Pertinent Vitals/Pain Pain Assessment: No/denies pain    Home Living                      Prior Function            PT Goals (current goals can now be found in the care plan section) Acute Rehab PT Goals Patient Stated Goal: Did not state PT Goal Formulation: With patient Time For Goal Achievement: 01/18/20 Potential to Achieve Goals: Fair Progress towards PT goals: Progressing toward goals    Frequency    Min 3X/week      PT Plan Current plan remains appropriate    Co-evaluation              AM-PAC PT "6 Clicks" Mobility  Outcome Measure  Help needed turning from your back to your side while in a flat bed without using bedrails?: A Little Help needed moving from lying on your back to sitting on the side of a flat bed without using bedrails?: A Little Help needed moving to and from a bed to a chair (including a wheelchair)?: A Little Help needed standing up from a chair using your arms (e.g., wheelchair or bedside chair)?: A Little Help needed to walk in hospital room?: A Little Help needed climbing 3-5 steps with a railing? : A Lot 6 Click Score: 17    End of Session Equipment Utilized During Treatment: Gait belt Activity Tolerance: Patient limited by fatigue Patient left: with call bell/phone within reach;in chair;with family/visitor present;with chair alarm set Nurse Communication: Mobility  status PT Visit Diagnosis: Unsteadiness on feet (R26.81);Muscle weakness (generalized) (M62.81);Difficulty in walking, not elsewhere classified (R26.2)     Time: 4193-7902 PT Time Calculation (min) (ACUTE ONLY): 25 min  Charges:  $Gait Training: 8-22 mins $Therapeutic Activity: 8-22 mins                     Mauro Kaufmann PT Acute Rehabilitation Services Pager [phone] Office [phone]    Zavia Pullen 01/11/2020, 4:13 PM

## 2020-01-11 NOTE — TOC Transition Note (Signed)
Transition of Care [facility]) - CM/SW Discharge Note   Patient Details  Name: Shawn Davis MRN: [id number] Date of Birth: 19-Dec-1935  [facility] CM/SW Contact:  [name] Phone Number: [phone] 01/11/2020, 11:47 AM   Clinical Narrative:     CSW confirmed with Attending patient d/c today. CSW called patient's daughter [name] and she confirmed she will be transporting her father to SNF facility in [place].  CSW stated if they had any questions or concerns to request CSW, [name] verbalized understanding.  TOC consult complete.  Final next level of care: Skilled Nursing Facility Barriers to Discharge: Continued Medical Work up   Patient Goals and CMS Choice Patient states their goals for this hospitalization and ongoing recovery are:: to go home CMS Medicare.gov Compare Post Acute Care list provided to:: Patient Choice offered to / list presented to : Patient  Discharge Placement                       Discharge Plan and Services   Discharge Planning Services: CM Consult Post Acute Care Choice: Home Health          DME Arranged: N/A DME Agency: NA                  Social Determinants of Health (SDOH) Interventions     Readmission Risk Interventions No flowsheet data found.

## 2020-01-11 NOTE — Discharge Instructions (Signed)
Advised to take prednisone 40 mg daily for three more days to finish 5 days course. Advised to continue Yupelri inhaler as prescribed.

## 2020-01-11 NOTE — Progress Notes (Signed)
Resumed care for this pt at 0300. I agree with previous RN's assessment. Pt sitting in bed watching TV. No complaints or requests at this time.

## 2020-01-11 NOTE — Progress Notes (Addendum)
PROGRESS NOTE    Shawn Davis  NIO:[id number] DOB: May 28, 1935 DOA: 01/03/2020 PCP: Marlise Eves, MD   Brief Narrative:  84 y.o.malewith medical history significant ofsteroid dependent COPD, h/o aspiration and aspiration pneumonia, BPH, protein calorie malnutrition,  depression who has had rapid weight loss and failure to thrive and sent to the ED at [facility] 01/01/20 where work-up showed CT scan showing intra-/extrahepatic duct dilatation diagnosing acute pancreatitis and then transferred to [facility] hospital 01/03/20. Atoutside facility diagnosed as pancreatitis: MRCP withintrahepatic biliary dilatation, questionable filling defects at the confluence of the right and left hepatic ducts, third spacing of fluid with trace ascites, mesenteric edema and subcutaneous edema. Status post ERCP, biliary stricture brushed, sludge extracted and plastic stent placed in the pancreatic duct.LFTs downtrending.  Patient became lethargic yesterday retaining PCO2, much improved today.  He is back to his baseline mental status, having breakfast.  Patient is being discharged to skilled nursing facility for rehab.    Assessment & Plan:   Active Problems:   Pancreatitis, acute   Steroid-dependent COPD (HCC)   BPH (benign prostatic hyperplasia)   Chronic pulmonary aspiration   Protein-calorie malnutrition, severe   Acute on chronic hypercapnic and hypoxic respiratory failure- Baseline PCO2 likely in high 50s. on 8/20, difficult to arouse,  ABG shows PCO2 70s- discussed with family /daughter  agreed for BiPAP trial-Fu ABG obtained and pCO2 better at 58 and ph 7.44. He takes shallows breath in sleep and TV low in 200s but when awake TV improves well. It was discussed with PCCM who will consult him- cont BIPAP/o2 management per PCCM.  Difficult to arouse from hypercapnea with acute metabolic encephalopathy- will do BiPAP trial as above. Somewhat more alert after  bipap.  Abnormal LFTs with dilated biliary treesecondary to biliary stricture: Atoutside facility diagnosed as pancreatitis:MRCP withintrahepatic biliary dilatation, questionable filling defects at the confluence of the right and left hepatic ducts, third spacing of fluid with trace ascites, mesenteric edema and subcutaneous edema. Status post ERCP, biliary stricture brushed, sludge extracted and plastic stent placed in the pancreatic duct.  Patient was seen by GI and signed off, he finished IV antibiotics 24 hours post ERCP.  lfts improving, follow-up with GI for KUB in 2 weeks to ensure plastic stent has passed.  Dysphagia:  Patient had difficulty swallowing seen by speech -per family request placed on PUREED. Continue the diet, continue aspiration precaution  with speech  Failure to thrive/rapid weight loss severe protein calorie malnutritin: Oral intake marginal,  continue on Augmented diet, his nutritional status, appreciate dietitian input, is being followed by speech and dietitian at home.  Overall prognosis is guarded.   COPD/Chronic hypoxic respiratory failure on 2-3 l [place]. per daughter "He has trouble breathing all the time" and has had recent hospitalization with subsequent decline failure to thrive: Continue inhaler.  This morning BiPAP trial due to his altered mental status metabolic encephalopathy  Hypokalemia-replace.continue supplementation.  GHW:EXHBZJ on Flomax and finasteride  Chronic pulmonary aspiration history followed by speech and nutrition at home.  Underweight severe protein calorie malnutrition- augment diet  GOC:DNR, Overall prognosis is guarded with his FTT/Severe protein calorie malnutrition.  Palliative care consulted,  recommended palliative care in the skilled nursing facility.    DVT prophylaxis: SCDs Code Status: DNR/DNI Family Communication: plan of care discussed with patient's wife and daughter at bedside. Disposition Plan: Palliative  care consulted to discuss goals of care.  Dispo: The patient is from: Home  Anticipated d/c is to:  SNF  Anticipated d/c date is: Patient is discharged back to skilled nursing facility today.  Patient currently is medically stable to d/c.    Consultants:  Gastroenterology  Procedures:  ERCP Antimicrobials: Anti-infectives (From admission, onward)   Start     Dose/Rate Route Frequency Ordered Stop   01/04/20 0000  piperacillin-tazobactam (ZOSYN) IVPB 3.375 g  Status:  Discontinued        3.375 g 100 mL/hr over 30 Minutes Intravenous Every 6 hours 01/03/20 2230 01/03/20 2235   01/03/20 2245  piperacillin-tazobactam (ZOSYN) IVPB 3.375 g        3.375 g 12.5 mL/hr over 240 Minutes Intravenous Every 8 hours 01/03/20 2235 01/06/20 2359      Subjective: Patient was seen and examined at bedside.  He is more alert,  awake following commands. He said he is much improved than he was yesterday.  Patient was having breakfast, daughter and wife at bedside.  Objective: Vitals:   01/10/20 2013 01/10/20 2049 01/11/20 0455 01/11/20 0901  BP:  (!) 119/54 (!) 149/72   Pulse:  79 72   Resp:  16 16   Temp:  98.7 F (37.1 C) 98.1 F (36.7 C)   TempSrc:  Oral Oral   SpO2: 99% 100% 99% 96%  Weight:      Height:        Intake/Output Summary (Last 24 hours) at 01/11/2020 1108 Last data filed at 01/11/2020 0500 Gross per 24 hour  Intake 20 ml  Output 700 ml  Net -680 ml   Filed Weights   01/05/20 1236  Weight: 47.6 kg    Examination:  General exam: Appears calm and comfortable  Respiratory system: Clear to auscultation. Respiratory effort normal. Cardiovascular system: S1 & S2 heard, RRR. No JVD, murmurs, rubs, gallops or clicks. No pedal edema. Gastrointestinal system: Abdomen is nondistended, soft and nontender. No organomegaly or masses felt. Normal bowel sounds heard. Central nervous system: Alert and oriented. No focal neurological  deficits. Extremities: No leg swelling. No cyanosis, no edema. Skin: No rashes, lesions or ulcers Psychiatry: Judgement and insight appear normal. Mood & affect appropriate.     Data Reviewed: I have personally reviewed following labs and imaging studies  CBC: Recent Labs  Lab 01/05/20 0501 01/06/20 0512 01/08/20 0514 01/10/20 0614 01/11/20 0531  WBC 5.0 5.1 7.5 7.9 7.8  HGB 11.5* 11.7* 12.2* 11.4* 11.6*  HCT 35.1* 35.9* 37.7* 35.5* 37.0*  MCV 98.3 98.9 99.7 101.1* 103.4*  PLT 170 178 190 202 227   Basic Metabolic Panel: Recent Labs  Lab 01/06/20 0512 01/08/20 0514 01/09/20 0958 01/10/20 0614 01/11/20 0531  NA 135 137 140 140 144  K 4.0 3.3* 4.1 4.6 4.9  CL 98 95* 95* 95* 94*  CO2 30 34* 39* 40* 43*  GLUCOSE 92 91 122* 103* 105*  BUN 17 15 14 16 21   CREATININE 0.61 0.51* 0.54* 0.55* 0.59*  CALCIUM 8.7* 8.7* 8.9 9.1 9.7  MG  --   --   --   --  2.1  PHOS  --   --   --   --  2.8   GFR: Estimated Creatinine Clearance: 47.9 mL/min (A) (by C-G formula based on SCr of 0.59 mg/dL (L)). Liver Function Tests: Recent Labs  Lab 01/07/20 0512 01/08/20 0514 01/09/20 0958 01/10/20 0614 01/11/20 0531  AST 86* 78* 66* 57* 43*  ALT 211* 184* 160* 133* 109*  ALKPHOS 198* 183* 212* 206* 196*  BILITOT 1.5* 1.7* 1.3* 1.0 0.9  PROT  5.5* 5.4* 6.0* 5.6* 5.6*  ALBUMIN 3.3* 3.1* 3.3* 3.1* 3.1*   No results for input(s): LIPASE, AMYLASE in the last 168 hours. Recent Labs  Lab 01/10/20 0614  AMMONIA 19   Coagulation Profile: No results for input(s): INR, PROTIME in the last 168 hours. Cardiac Enzymes: No results for input(s): CKTOTAL, CKMB, CKMBINDEX, TROPONINI in the last 168 hours. BNP (last 3 results) No results for input(s): PROBNP in the last 8760 hours. HbA1C: No results for input(s): HGBA1C in the last 72 hours. CBG: No results for input(s): GLUCAP in the last 168 hours. Lipid Profile: No results for input(s): CHOL, HDL, LDLCALC, TRIG, CHOLHDL, LDLDIRECT in the  last 72 hours. Thyroid Function Tests: No results for input(s): TSH, T4TOTAL, FREET4, T3FREE, THYROIDAB in the last 72 hours. Anemia Panel: No results for input(s): VITAMINB12, FOLATE, FERRITIN, TIBC, IRON, RETICCTPCT in the last 72 hours. Sepsis Labs: No results for input(s): PROCALCITON, LATICACIDVEN in the last 168 hours.  Recent Results (from the past 240 hour(s))  SARS Coronavirus 2 by RT PCR (hospital order, performed in [facility] hospital lab) Nasopharyngeal Nasopharyngeal Swab     Status: None   Collection Time: 01/04/20 11:05 AM   Specimen: Nasopharyngeal Swab  Result Value Ref Range Status   SARS Coronavirus 2 NEGATIVE NEGATIVE Final    Comment: (NOTE) SARS-CoV-2 target nucleic acids are NOT DETECTED.  The SARS-CoV-2 RNA is generally detectable in upper and lower respiratory specimens during the acute phase of infection. The lowest concentration of SARS-CoV-2 viral copies this assay can detect is 250 copies / mL. A negative result does not preclude SARS-CoV-2 infection and should not be used as the sole basis for treatment or other patient management decisions.  A negative result may occur with improper specimen collection / handling, submission of specimen other than nasopharyngeal swab, presence of viral mutation(s) within the areas targeted by this assay, and inadequate number of viral copies (<250 copies / mL). A negative result must be combined with clinical observations, patient history, and epidemiological information.  Fact Sheet for Patients:   BoilerBrush.com.cy  Fact Sheet for Healthcare Providers: https://pope.com/  This test is not yet approved or  cleared by the [place] FDA and has been authorized for detection and/or diagnosis of SARS-CoV-2 by FDA under an Emergency Use Authorization (EUA).  This EUA will remain in effect (meaning this test can be used) for the duration of the COVID-19 declaration  under Section 564(b)(1) of the Act, 21 U.S.C. section 360bbb-3(b)(1), unless the authorization is terminated or revoked sooner.  Performed at [facility], [address], [place], [place] [address]   SARS CORONAVIRUS 2 (TAT 6-24 HRS) Nasopharyngeal Nasopharyngeal Swab     Status: None   Collection Time: 01/10/20  3:21 PM   Specimen: Nasopharyngeal Swab  Result Value Ref Range Status   SARS Coronavirus 2 NEGATIVE NEGATIVE Final    Comment: (NOTE) SARS-CoV-2 target nucleic acids are NOT DETECTED.  The SARS-CoV-2 RNA is generally detectable in upper and lower respiratory specimens during the acute phase of infection. Negative results do not preclude SARS-CoV-2 infection, do not rule out co-infections with other pathogens, and should not be used as the sole basis for treatment or other patient management decisions. Negative results must be combined with clinical observations, patient history, and epidemiological information. The expected result is Negative.  Fact Sheet for Patients: HairSlick.no  Fact Sheet for Healthcare Providers: quierodirigir.com  This test is not yet approved or cleared by the [place] FDA  and  has been authorized for detection and/or diagnosis of SARS-CoV-2 by FDA under an Emergency Use Authorization (EUA). This EUA will remain  in effect (meaning this test can be used) for the duration of the COVID-19 declaration under Se ction 564(b)(1) of the Act, 21 U.S.C. section 360bbb-3(b)(1), unless the authorization is terminated or revoked sooner.  Performed at [facility], [address] [place], [place] [address]      Radiology Studies: No results found.  Scheduled Meds: . arformoterol  15 mcg Nebulization BID  . feeding supplement (ENSURE ENLIVE)  237 mL Oral TID BM  . finasteride  5 mg Oral Daily  . FLUoxetine  40 mg Oral Daily  . folic acid  1 mg Oral Daily  .  multivitamin  15 mL Oral Daily  . predniSONE  40 mg Oral Q breakfast  . revefenacin  175 mcg Nebulization Daily  . senna  1 tablet Oral BID  . tamsulosin  0.4 mg Oral Daily  . thiamine  100 mg Oral Daily   Continuous Infusions:   LOS: 8 days    Time spent: 25 mins.    Cipriano BunkerPARDEEP Marelin Tat, MD [facility]   If 7PM-7AM, please contact night-coverage

## 2020-01-11 NOTE — Plan of Care (Signed)
  Problem: Clinical Measurements: Goal: Ability to maintain clinical measurements within normal limits will improve Outcome: Progressing   

## 2020-01-19 NOTE — Telephone Encounter (Signed)
The pt's daughter states that the pt is in a nursing facility and will be setting up the KUB.

## 2020-02-20 DEATH — deceased

## 2020-03-08 ENCOUNTER — Telehealth: Payer: Self-pay

## 2020-03-08 NOTE — Telephone Encounter (Signed)
-----   Message from Loretha Stapler, RN sent at 01/09/2020 10:56 AM EDT ----- Mansouraty, Netty Starring., MD  Loretha Stapler, RN Tysheena Ginzburg,  Please send letter when able.  The patient needs KUB in 2 weeks. He lives far away and [place], [place] so likely will need to have a local radiology study done at the hospital near their home (I believe is called [facility]).  Please reach out to the patient's daughter to help with arranging this.  An ERCP should be scheduled in the next 6 to 10 weeks and I would go ahead and get it on the books.  Thanks.  GM

## 2020-03-09 ENCOUNTER — Other Ambulatory Visit: Payer: Self-pay

## 2020-03-09 DIAGNOSIS — K839 Disease of biliary tract, unspecified: Secondary | ICD-10-CM

## 2020-03-09 DIAGNOSIS — K859 Acute pancreatitis without necrosis or infection, unspecified: Secondary | ICD-10-CM

## 2020-03-09 DIAGNOSIS — T85520S Displacement of bile duct prosthesis, sequela: Secondary | ICD-10-CM

## 2020-03-09 NOTE — Telephone Encounter (Signed)
Left message on machine to call back  

## 2020-03-09 NOTE — Telephone Encounter (Signed)
ERCP 03/31/20 1115 am [facility] with Dr Meridee Score COVID test on

## 2020-03-10 NOTE — Telephone Encounter (Signed)
I am sorry to hear this. Thank you for update. GM

## 2020-03-10 NOTE — Telephone Encounter (Signed)
The pt has been cancelled.  Per the pt daughter the pt passed away in 02-27-23.

## 2020-03-10 NOTE — Telephone Encounter (Signed)
Message left on the pt cell and home as well as the pt's daughter

## 2020-03-27 ENCOUNTER — Other Ambulatory Visit (HOSPITAL_COMMUNITY): Payer: No Typology Code available for payment source

## 2020-03-31 ENCOUNTER — Ambulatory Visit (HOSPITAL_COMMUNITY): Admit: 2020-03-31 | Payer: No Typology Code available for payment source | Admitting: Gastroenterology

## 2020-03-31 SURGERY — ENDOSCOPIC RETROGRADE CHOLANGIOPANCREATOGRAPHY (ERCP) WITH PROPOFOL
Anesthesia: General

## 2022-05-08 IMAGING — MR MR ABDOMEN WO/W CM MRCP
17 of 21 series · 38 of 48 positions shown · IV contrast (gadavist)
Comparison: None.

CLINICAL DATA: Pancreatitis



[Series 3: T2 fat-sat · axial · 6.0mm · 1.14mm/px · 1 of 36 slices shown]
[im 1/36]
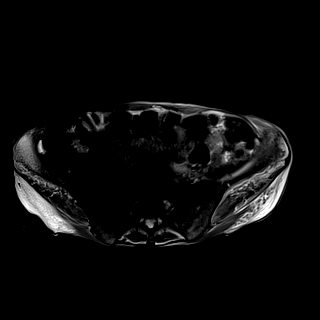

[Series 6: T2 · coronal · 6.0mm · 1.48mm/px · 1 of 30 slices shown (1 of 2)]
[im 1/30]
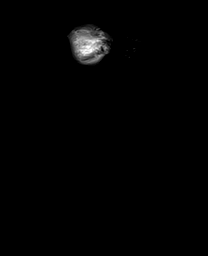

[Series 7: DWI · axial · 6.0mm · 1.36mm/px · z∈[-202,+50]mm · 3 of 72 slices shown (1 of 2)]
[im 1/72]
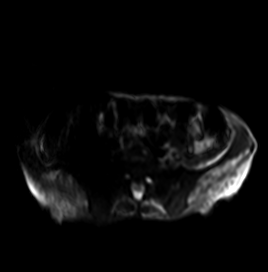
[im 36/72]
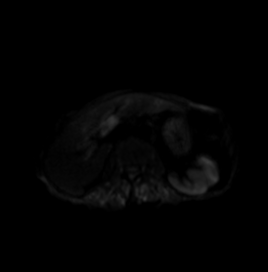
[im 72/72]
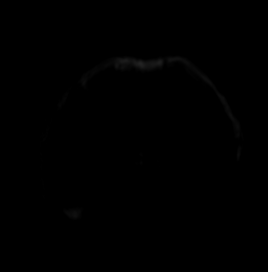

[Series 8: DWI · axial · 6.0mm · 1.36mm/px · 1 of 36 slices shown (2 of 2)]
[im 1/36]
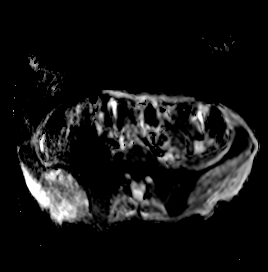

[Series 9: T1 · axial · 3.0mm · 1.06mm/px · z∈[-177,+36]mm · 3 of 72 slices shown (1 of 2)]
[im 1/72]
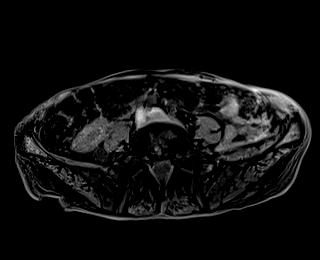
[im 36/72]
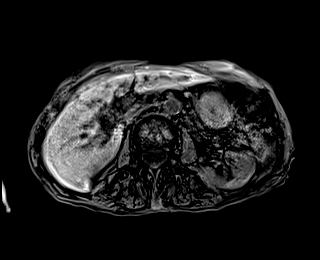
[im 72/72]
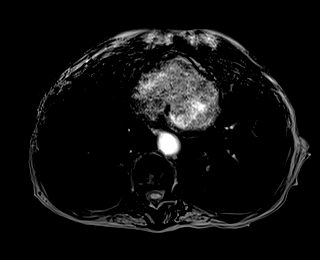

[Series 10: T1 · axial · 3.0mm · 1.06mm/px · z∈[-177,+36]mm · 3 of 72 slices shown (2 of 2)]
[im 1/72]
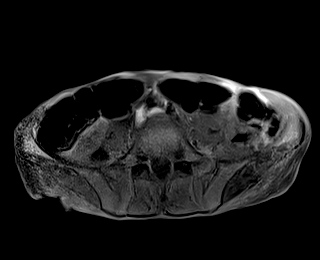
[im 36/72]
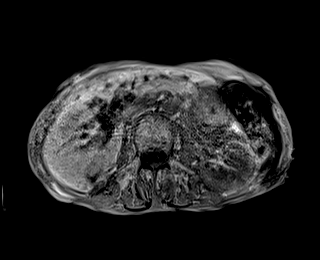
[im 72/72]
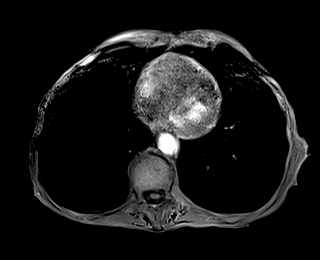

[Series 11: cor obl thk · sagittal · 50.0mm · 0.78mm/px · 1 of 9 slices shown]
[im 1/9]
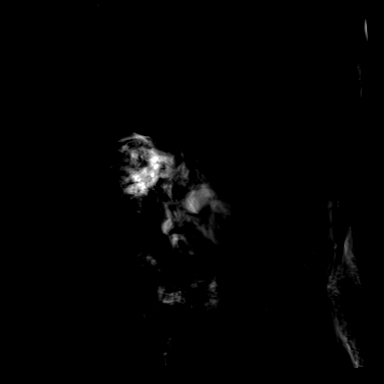

[Series 13: cor_3d_spc_trig · coronal · 1.0mm · 0.49mm/px · 3 of 80 slices shown]
[im 1/80]
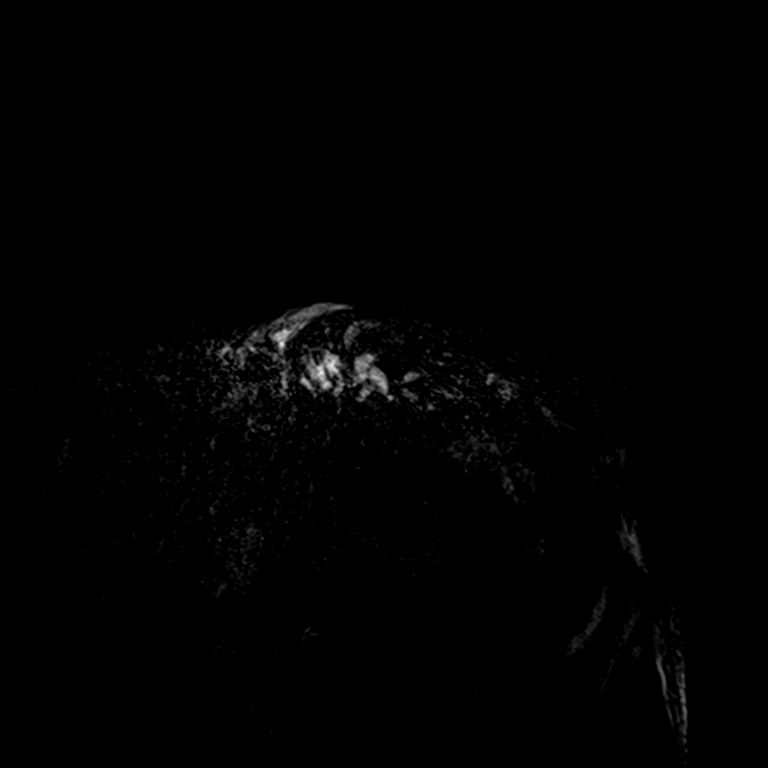
[im 40/80]
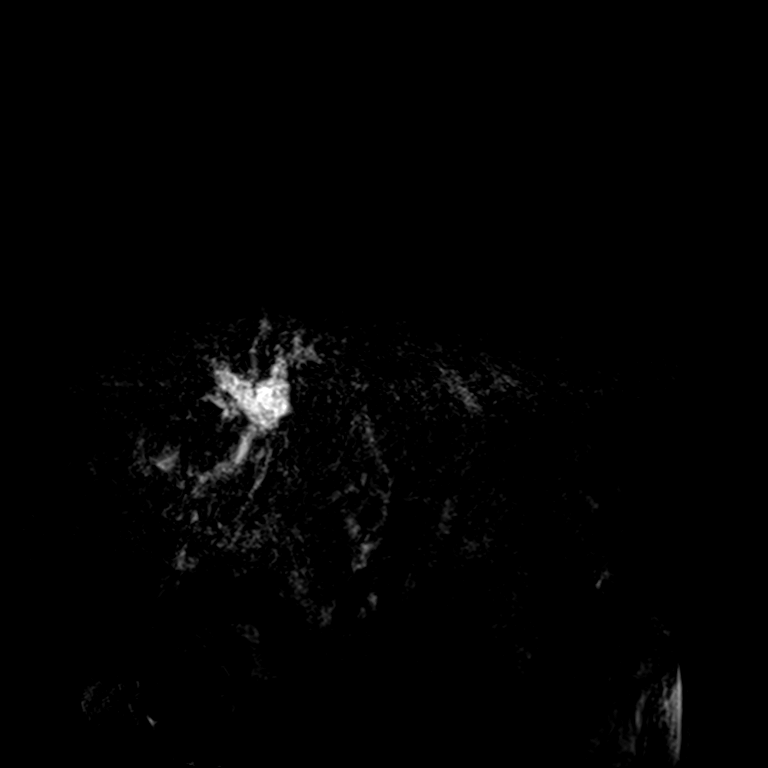
[im 80/80]
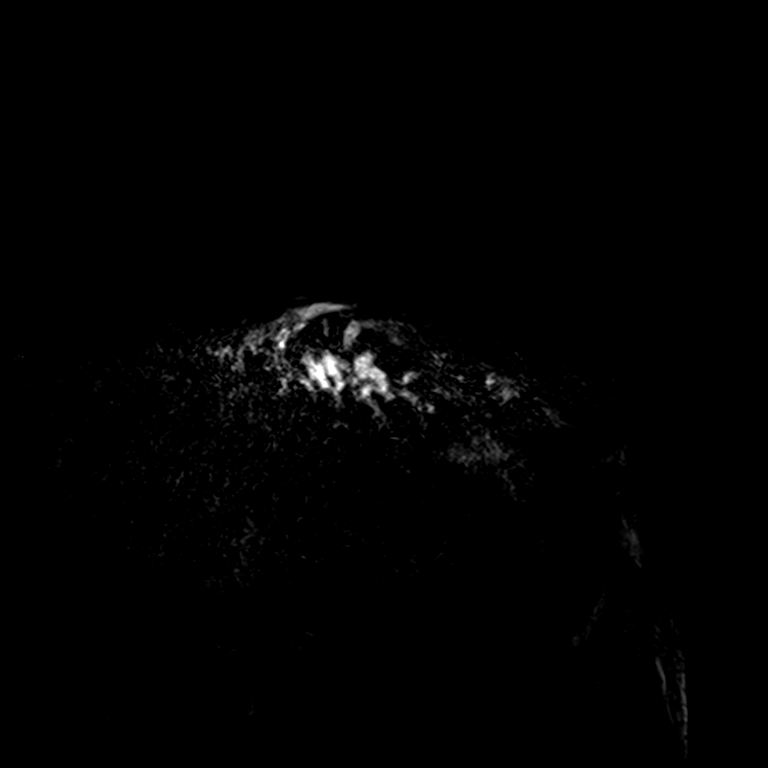

[Series 15: T2 · axial · 6.0mm · 1.33mm/px · 1 of 30 slices shown (2 of 2)]
[im 1/30]
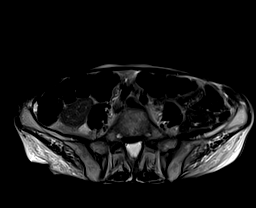

[Series 17: T1 dynamic · axial · 3.0mm · 1.25mm/px · z∈[-193,+44]mm · 3 of 80 slices shown (1 of 6)]
[im 1/80]
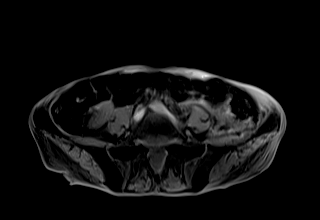
[im 40/80]
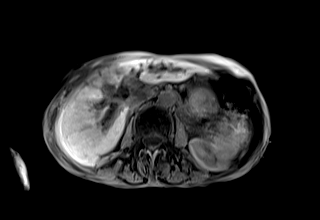
[im 80/80]
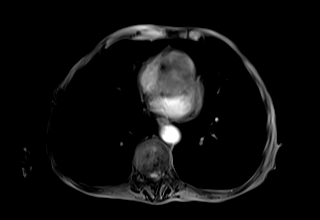

[Series 20: T1 dynamic · axial · 3.0mm · 1.25mm/px · z∈[-193,+44]mm · 3 of 80 slices shown (2 of 6)]
[im 1/80]
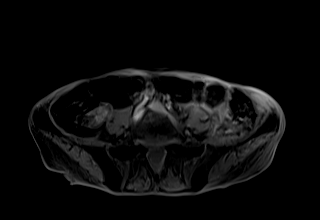
[im 40/80]
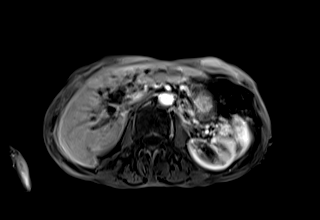
[im 80/80]
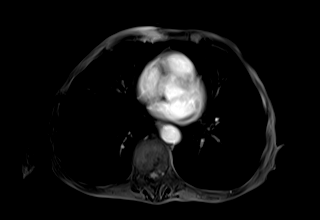

[Series 22: T1 dynamic · axial · 3.0mm · 1.25mm/px · z∈[-193,+44]mm · 3 of 80 slices shown (3 of 6)]
[im 1/80]
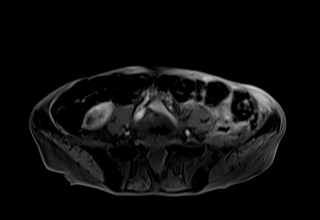
[im 40/80]
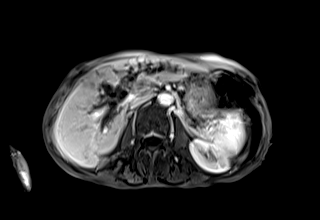
[im 80/80]
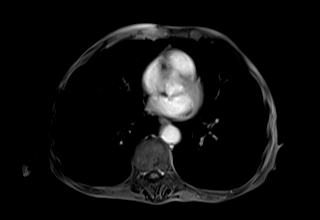

[Series 25: T1 dynamic · axial · 3.0mm · 1.25mm/px · z∈[-193,+44]mm · 3 of 80 slices shown (4 of 6)]
[im 1/80]
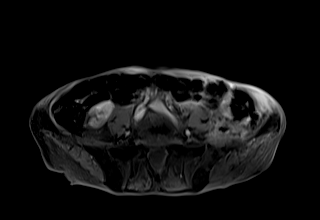
[im 40/80]
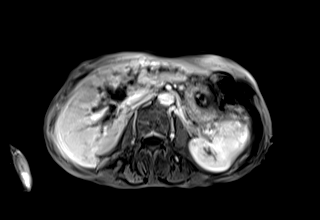
[im 80/80]
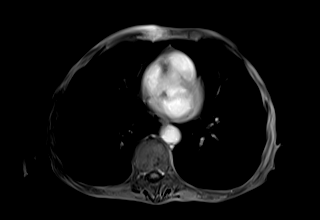

[Series 29: T1 dynamic · axial · 3.0mm · 1.25mm/px · z∈[-193,+44]mm · 3 of 80 slices shown (5 of 6)]
[im 1/80]
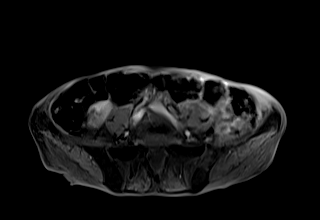
[im 40/80]
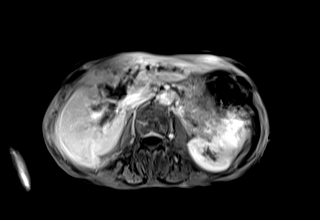
[im 80/80]
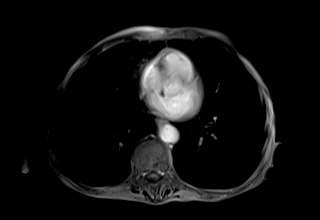

[Series 31: T1 dynamic · coronal · 4.0mm · 1.41mm/px · 2 of 56 slices shown (6 of 6)]
[im 1/56]
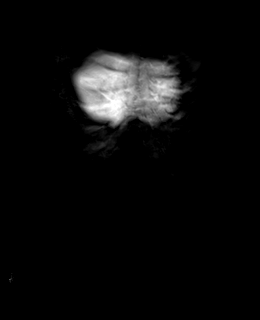
[im 56/56]
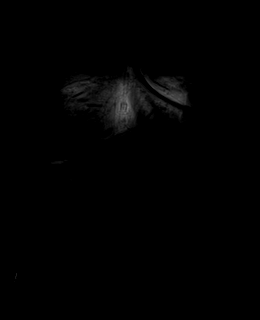

[Series 101: sub 20 sec · axial · 3.0mm · 1.25mm/px · z∈[-193,+44]mm · 3 of 80 slices shown]
[im 1/80]
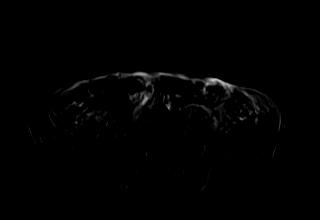
[im 40/80]
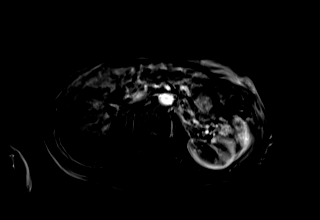
[im 80/80]
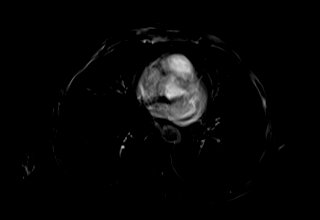

[Series 102: sub 45 sec · axial · 3.0mm · 1.25mm/px · 1 of 80 slices shown]
[im 1/80]
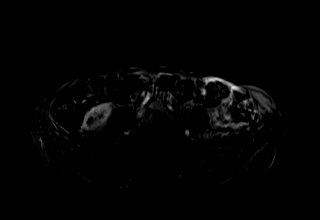

[38 of 48 positions shown; findings below may reference images not displayed]

FINDINGS: Despite efforts by the technologist and patient, motion artifact is
present on today's exam and could not be eliminated. This reduces
exam sensitivity and specificity.

Lower chest: Subsegmental atelectasis in the posterior basal
segments of both lower lobes.

Hepatobiliary: Prominent intrahepatic biliary dilatation. In the
confluence of the right and left hepatic ducts, there is a
suggestion of vague filling defects within the biliary tree, for
example on image [DATE], without definite enhancement. This could
represent sludge, blood products, tumor, stricture/infection, or
flow related artifact.

This is followed by a mildly dilated segment of the common hepatic
duct at 1.1 cm, which tapers distally and in the common bile duct as
shown on images 19 through 20 of series 6 in a conical fashion
towards the ampulla. I do not see a distal filling defect in the
common bile duct which is of more normal caliber.

Aside from the clearly abnormal biliary tree, no specific individual
parenchymal mass is identified

Pancreas: Normal caliber and course of the dorsal pancreatic duct.
No findings of pancreatic pseudocyst or abscess. Subject to
limitations by the motion artifact, no obvious pancreatic mass is
identified. There is low-grade edema tracking along the upper
abdominal mesentery but this is commensurate to the subcutaneous
edema and appears more generalized, and accordingly we do not
demonstrate specific imaging findings of acute pancreatitis.

Spleen:  Unremarkable

Adrenals/Urinary Tract: Right extrarenal pelvis without
hydronephrosis. Adrenal glands unremarkable.

Stomach/Bowel: Borderline prominence of colon caliber, constipation
not excluded. No dilated small bowel in the visualized region.

Vascular/Lymphatic: There is evidence of abdominal aortic
atherosclerosis along with potentially substantial atherosclerotic
narrowing of the proximal right common iliac artery.

Other: Diffuse subcutaneous edema with generalized edema along the
mesentery and tracking along the paraspinal musculature. Trace
perihepatic and perisplenic ascites.

Musculoskeletal: Mild dextroconvex scoliosis in the vicinity of the
thoracolumbar junction. Lumbar spondylosis and degenerative disc
disease.
IMPRESSION: 1. Motion artifact substantially degrades images and adversely
affects diagnostic accuracy.
2. Striking degree of intrahepatic biliary dilatation, questionable
filling defect(s) at the confluence of the right and left hepatic
ducts. Distal to this the common hepatic duct is only borderline
dilated in the distal common bile duct demonstrates normal conical
tapering course. No definite enhancing hepatic mass is identified.
3. No specific imaging findings of pancreatitis.
4. Third spacing of fluid with trace ascites, mesenteric edema, and
subcutaneous edema.
5. Aortic atherosclerosis, potentially with substantial
atherosclerotic narrowing of the proximal right common iliac artery.

## 2022-05-08 IMAGING — RF DG ERCP WO/W SPHINCTEROTOMY
1 series · 15 of 21 positions shown · non-contrast
Comparison: MRCP from earlier the same day

CLINICAL DATA: Pancreatitis, intrahepatic biliary ductal dilatation
with concern of lesion at the biliary confluence on MRCP.

EXAM:
ERCP
TECHNIQUE: Multiple spot images obtained with the fluoroscopic device and
submitted for interpretation post-procedure.

[Series 1: run · 18 acquisitions, 15 frames shown]
[im 1/18]
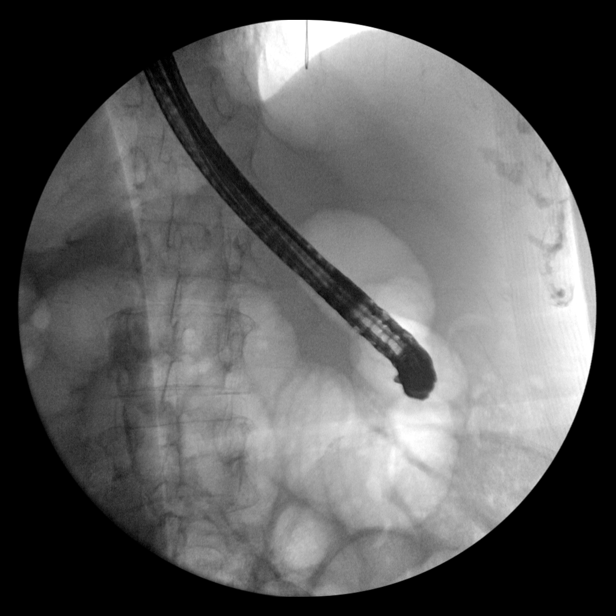
[im 3/18]
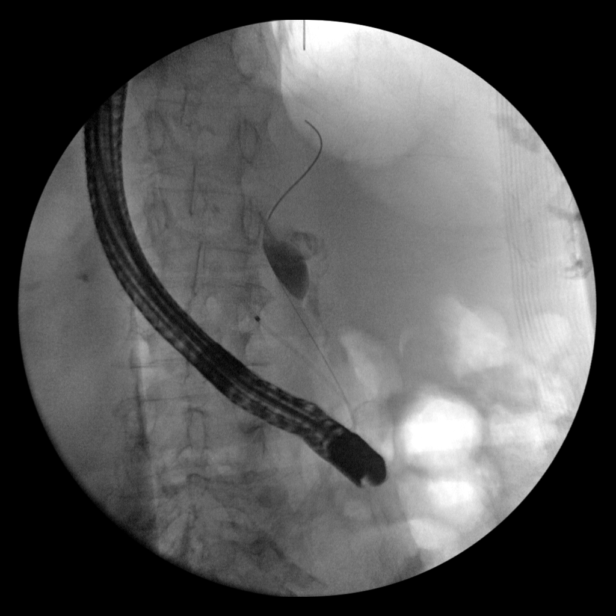
[im 4/18]
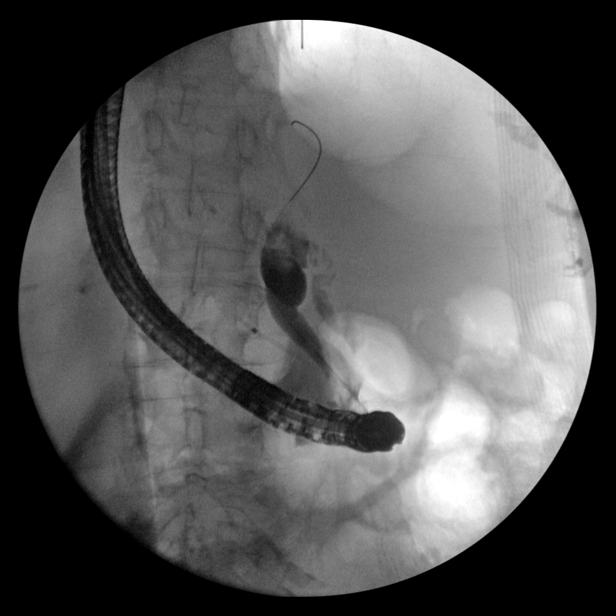
[im 5/18]
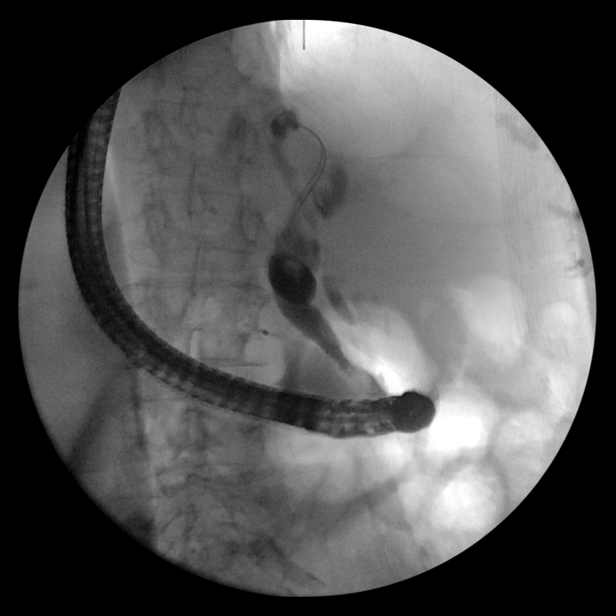
[im 7/18]
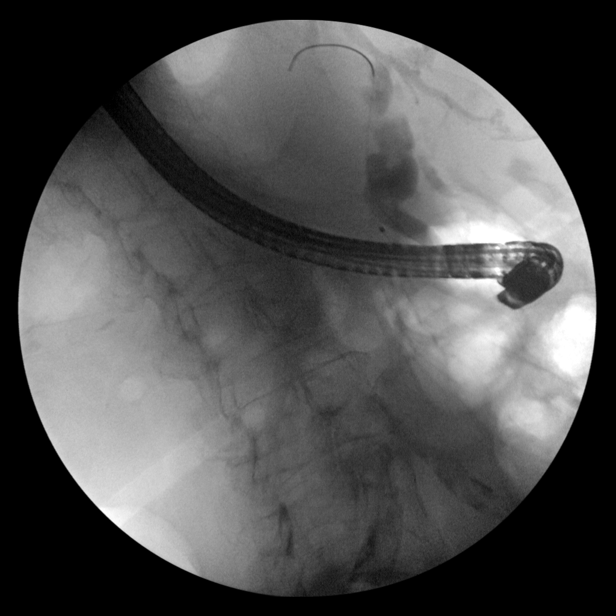
[im 8/18]
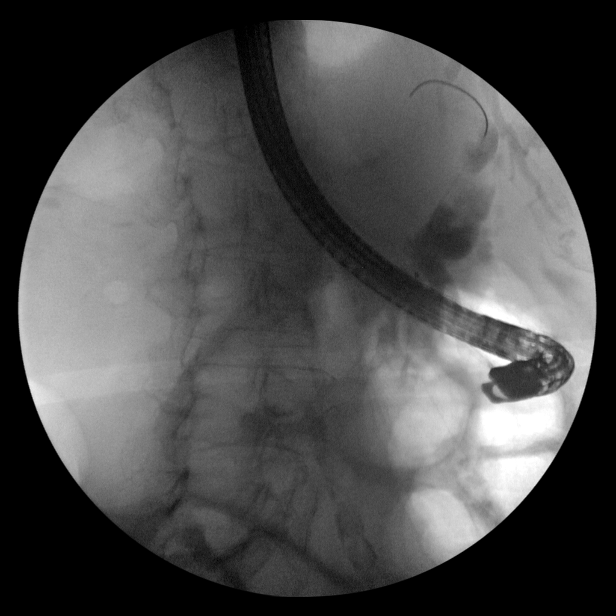
[im 10/18]
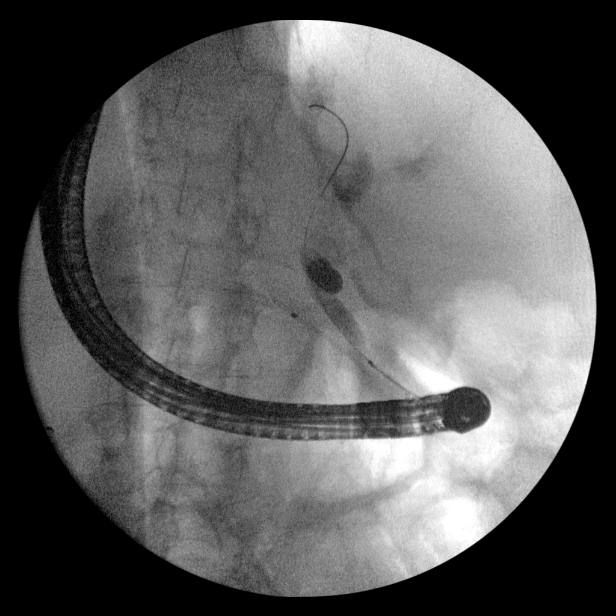
[im 11/18]
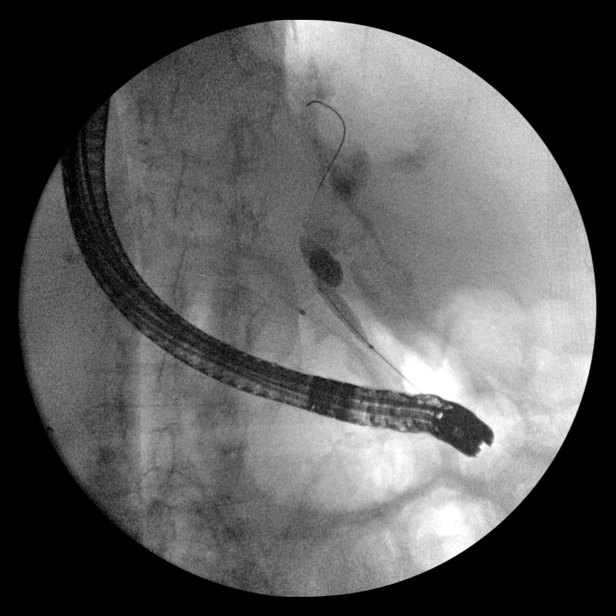
[im 12/18]
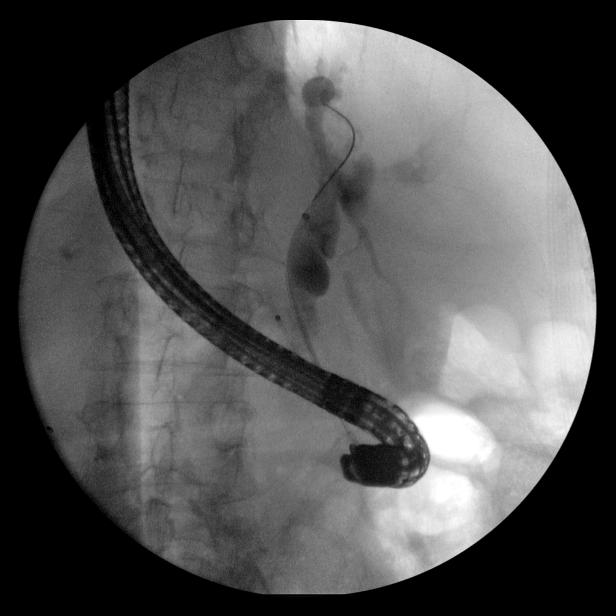
[im 12/18]
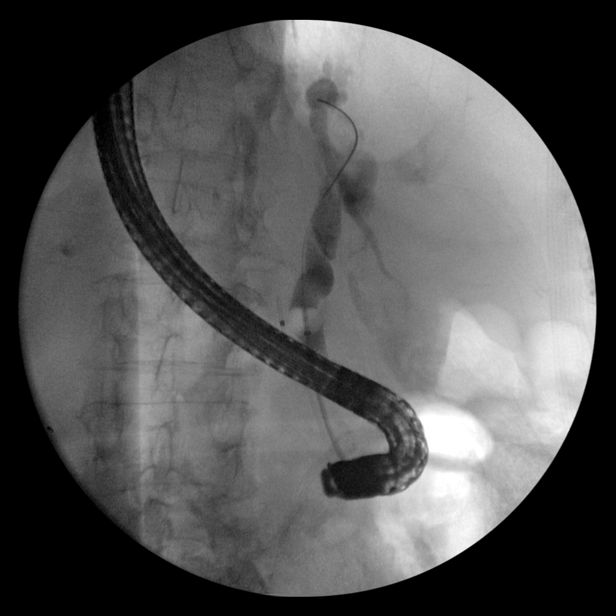
[im 12/18]
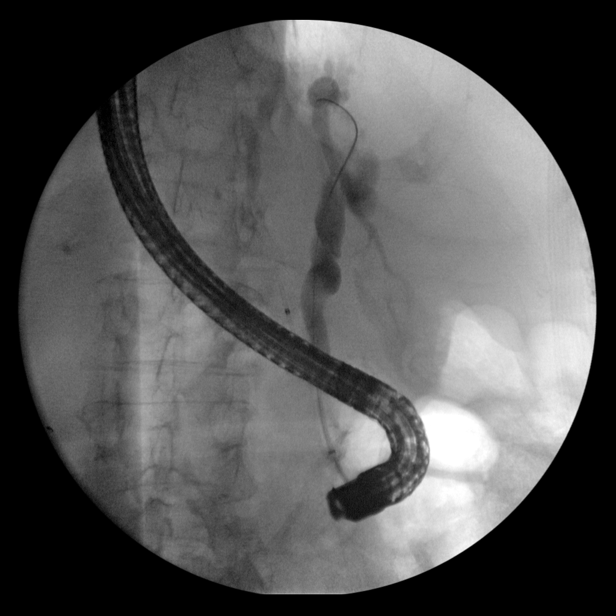
[im 14/18]
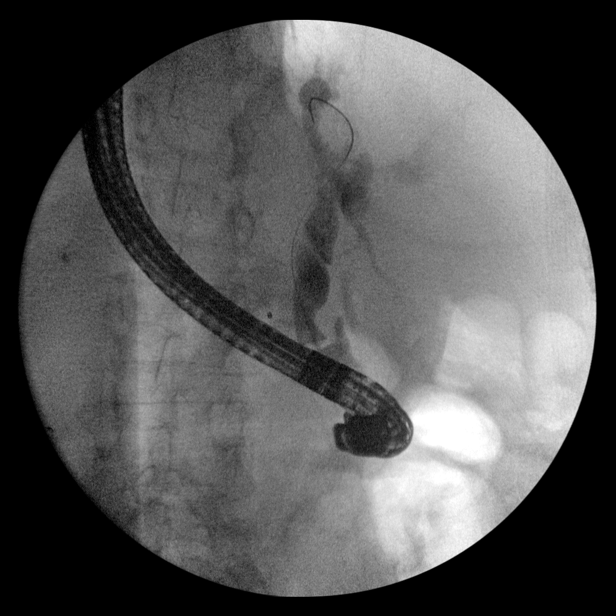
[im 15/18]
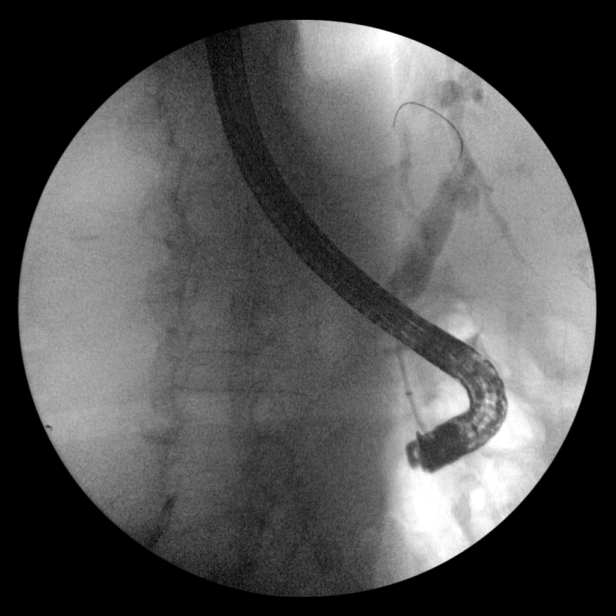
[im 16/18]
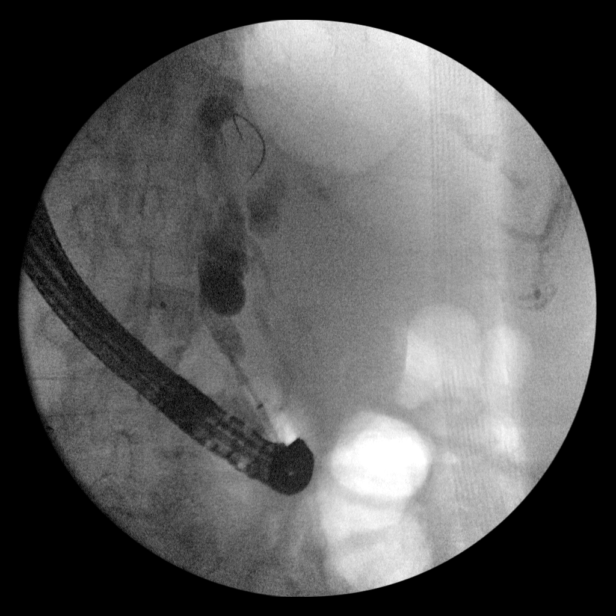
[im 18/18]
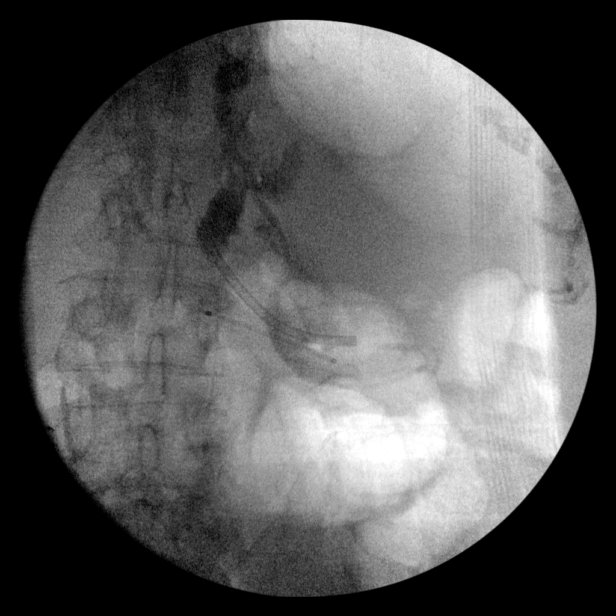

[15 of 21 positions shown; findings below may reference images not displayed]

FINDINGS: A series of fluoroscopic spot images document endoscopic cannulation
and partial opacification of the pancreatic duct and CBD. Limited
opacification of the intrahepatic biliary tree. Balloon catheter
passage through the CBD. Plastic stent deployment in the pancreatic
duct and CBD.
IMPRESSION: 1. Endoscopic intervention as above, with pancreatic and biliary
stent placement.

These images were submitted for radiologic interpretation only.
Please see the procedural report for the amount of contrast and the
fluoroscopy time utilized.
